# Patient Record
Sex: Male | Born: 1950 | Race: White | Hispanic: No | Marital: Married | Smoking: Never smoker
Health system: Southern US, Community
[De-identification: ages and names within clinical notes are randomized; demographics above are authoritative.]

## PROBLEM LIST (undated history)

## (undated) DIAGNOSIS — J45909 Unspecified asthma, uncomplicated: Secondary | ICD-10-CM

## (undated) DIAGNOSIS — K219 Gastro-esophageal reflux disease without esophagitis: Secondary | ICD-10-CM

## (undated) DIAGNOSIS — I10 Essential (primary) hypertension: Secondary | ICD-10-CM

## (undated) DIAGNOSIS — R7989 Other specified abnormal findings of blood chemistry: Secondary | ICD-10-CM

## (undated) DIAGNOSIS — Z5189 Encounter for other specified aftercare: Secondary | ICD-10-CM

## (undated) DIAGNOSIS — M1990 Unspecified osteoarthritis, unspecified site: Secondary | ICD-10-CM

## (undated) DIAGNOSIS — E785 Hyperlipidemia, unspecified: Secondary | ICD-10-CM

## (undated) DIAGNOSIS — Z86010 Personal history of colonic polyps: Principal | ICD-10-CM

## (undated) DIAGNOSIS — G060 Intracranial abscess and granuloma: Secondary | ICD-10-CM

## (undated) DIAGNOSIS — E119 Type 2 diabetes mellitus without complications: Secondary | ICD-10-CM

## (undated) DIAGNOSIS — R739 Hyperglycemia, unspecified: Secondary | ICD-10-CM

## (undated) DIAGNOSIS — K760 Fatty (change of) liver, not elsewhere classified: Secondary | ICD-10-CM

## (undated) DIAGNOSIS — R945 Abnormal results of liver function studies: Secondary | ICD-10-CM

## (undated) DIAGNOSIS — E669 Obesity, unspecified: Secondary | ICD-10-CM

## (undated) HISTORY — DX: Unspecified osteoarthritis, unspecified site: M19.90

## (undated) HISTORY — DX: Essential (primary) hypertension: I10

## (undated) HISTORY — DX: Gastro-esophageal reflux disease without esophagitis: K21.9

## (undated) HISTORY — DX: Type 2 diabetes mellitus without complications: E11.9

## (undated) HISTORY — DX: Personal history of colonic polyps: Z86.010

## (undated) HISTORY — PX: BRAIN SURGERY: SHX531

## (undated) HISTORY — DX: Hyperlipidemia, unspecified: E78.5

## (undated) HISTORY — DX: Abnormal results of liver function studies: R94.5

## (undated) HISTORY — DX: Intracranial abscess and granuloma: G06.0

## (undated) HISTORY — DX: Encounter for other specified aftercare: Z51.89

## (undated) HISTORY — PX: TONSILLECTOMY: SUR1361

## (undated) HISTORY — DX: Unspecified asthma, uncomplicated: J45.909

## (undated) HISTORY — DX: Other specified abnormal findings of blood chemistry: R79.89

## (undated) HISTORY — DX: Hyperglycemia, unspecified: R73.9

## (undated) HISTORY — DX: Fatty (change of) liver, not elsewhere classified: K76.0

## (undated) HISTORY — PX: COLONOSCOPY: SHX174

## (undated) HISTORY — DX: Obesity, unspecified: E66.9

## (undated) HISTORY — PX: OTHER SURGICAL HISTORY: SHX169

---

## 2001-08-15 ENCOUNTER — Encounter: Payer: Self-pay | Admitting: Endocrinology

## 2001-08-15 ENCOUNTER — Encounter: Admission: RE | Admit: 2001-08-15 | Discharge: 2001-08-15 | Payer: Self-pay | Admitting: Endocrinology

## 2001-08-22 ENCOUNTER — Ambulatory Visit (HOSPITAL_COMMUNITY): Admission: RE | Admit: 2001-08-22 | Discharge: 2001-08-22 | Payer: Self-pay | Admitting: Internal Medicine

## 2004-05-05 ENCOUNTER — Ambulatory Visit (HOSPITAL_COMMUNITY): Admission: RE | Admit: 2004-05-05 | Discharge: 2004-05-05 | Payer: Self-pay | Admitting: Gastroenterology

## 2004-05-05 ENCOUNTER — Encounter (INDEPENDENT_AMBULATORY_CARE_PROVIDER_SITE_OTHER): Payer: Self-pay | Admitting: Specialist

## 2004-08-14 ENCOUNTER — Encounter: Admission: RE | Admit: 2004-08-14 | Discharge: 2004-08-14 | Payer: Self-pay | Admitting: Internal Medicine

## 2004-08-21 ENCOUNTER — Encounter: Admission: RE | Admit: 2004-08-21 | Discharge: 2004-08-21 | Payer: Self-pay | Admitting: Internal Medicine

## 2005-07-01 IMAGING — CT CT ABDOMEN WO/W CM
2 of 4 series · 13 of 32 positions shown, 19 images · IV contrast (GASTRO.)
Comparison: none

CLINICAL DATA: Please evaluate right renal lesion. 
 CT OF THE ABDOMEN WITHOUT AND WITH IV CONTRAST 
 There is a 3.2 cm in size lesion projecting posteriorly from the cortex of the right kidney within its mid to upper pole portion.  This measures 26 plus or minus 18 Hounsfield units prior to IV contrast and 32 plus or minus 20 Hounsfield units following contrast.  As a result, I believe this most likely represents a mildly complicated cyst.  There are also smaller cysts seen associated with the left kidney.  There is diffuse fatty change noted within the liver and there is a probable small hiatal hernia present.  On several of the scans, there is a questionable tiny gallstone within the neck of the gallbladder.  There is a circumaortic left renal vein noted. 
 IMPRESSION
 1.  3.2 cm in size lesion associated with the posterior right kidney felt most likely to represent a mildly complicated renal cyst (please see above discussion).  
 2.  Fatty change noted within the liver. 
 3.  Question subtle gallstone within the neck of the gallbladder. 
 4.  Small hiatal hernia.  
 5.  Circumaortic left renal vein incidentally noted.

[Series 3: — · axial · 0.94mm/px · z∈[-262,+8]mm · 11 of 66 slices shown, 17 images (1 of 2)]
[im 6/66  soft-tissue]
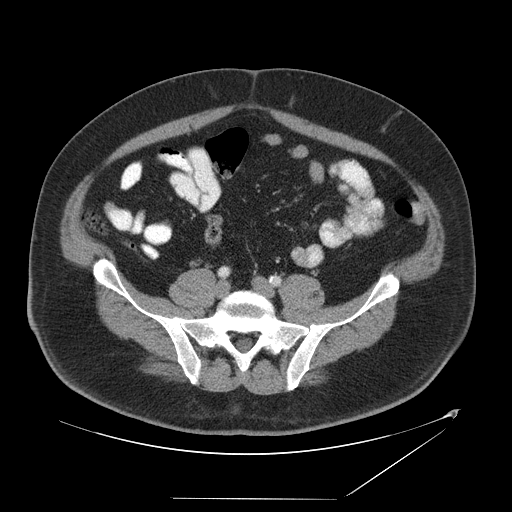
[im 6/66  bone]
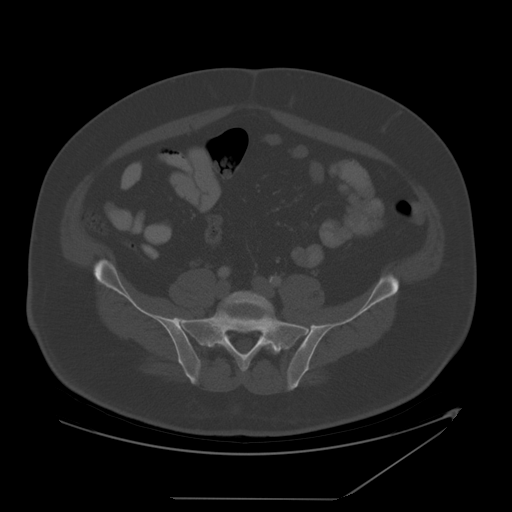
[im 11/66  soft-tissue]
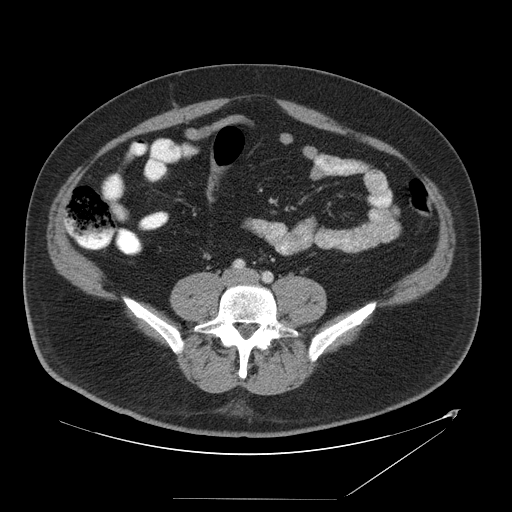
[im 17/66  soft-tissue]
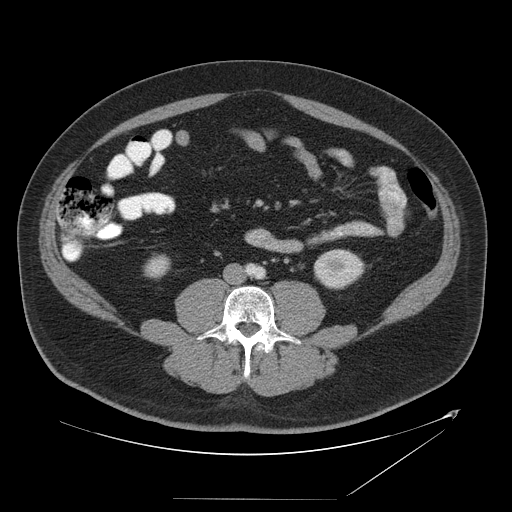
[im 22/66  soft-tissue]
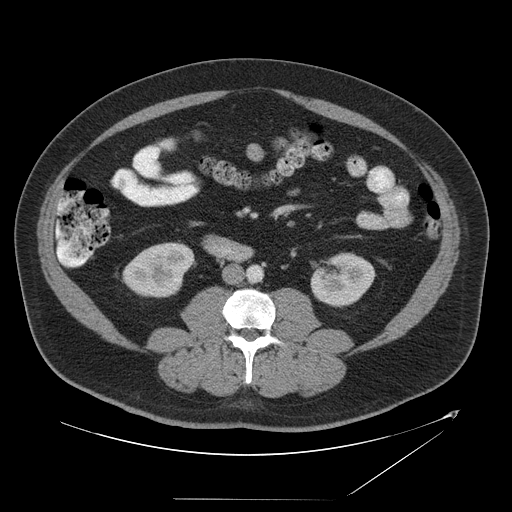
[im 28/66  soft-tissue]
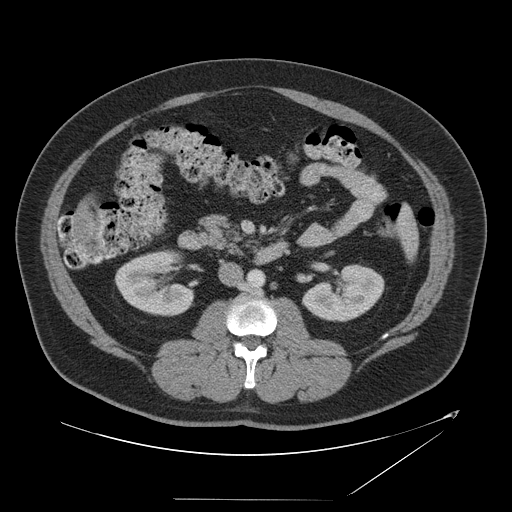
[im 33/66  soft-tissue]
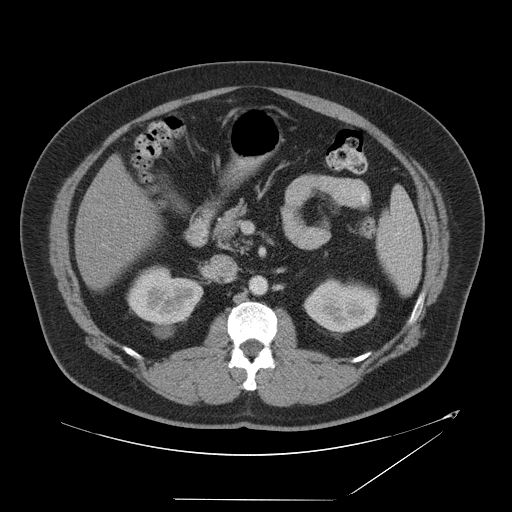
[im 38/66  soft-tissue]
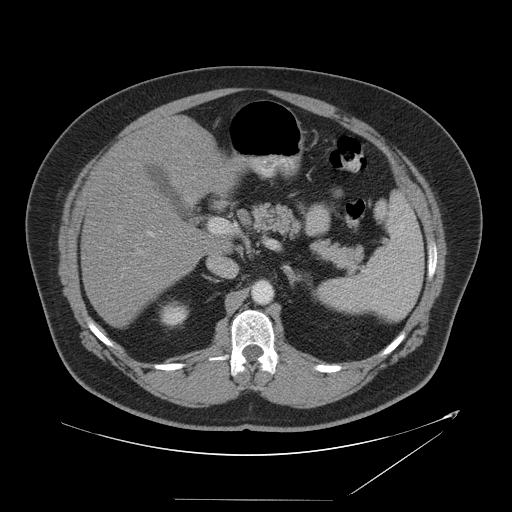
[im 44/66  soft-tissue]
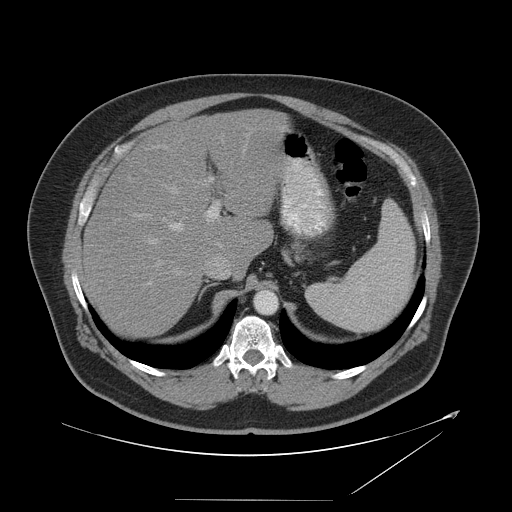
[im 44/66  lung]
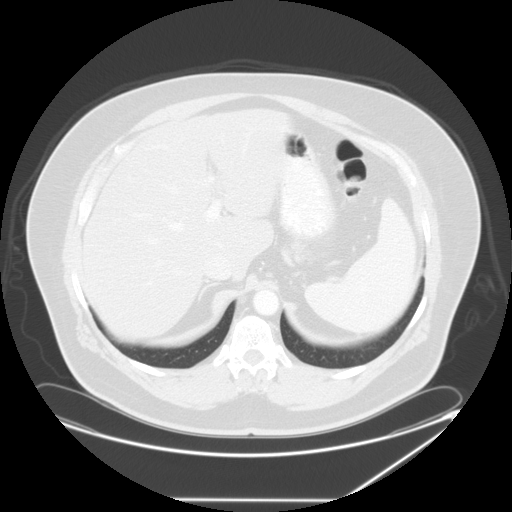
[im 49/66  soft-tissue]
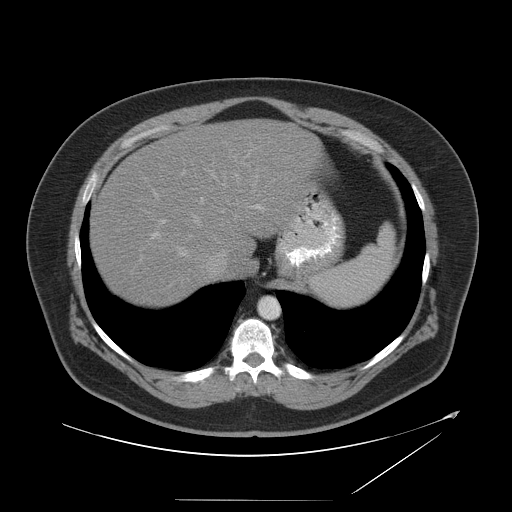
[im 49/66  lung]
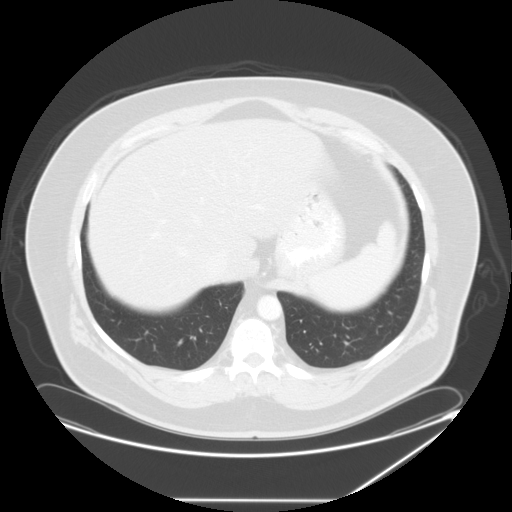
[im 49/66  bone]
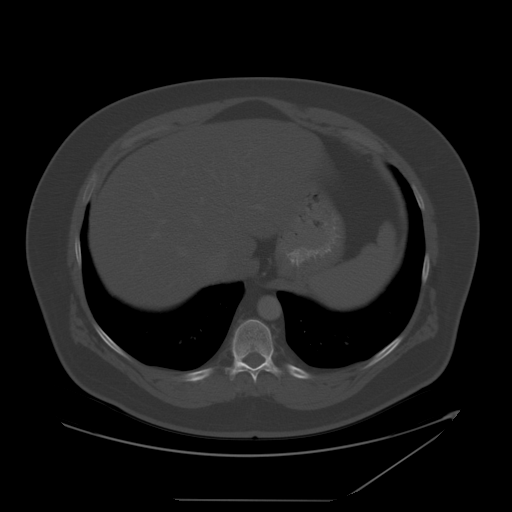
[im 55/66  soft-tissue]
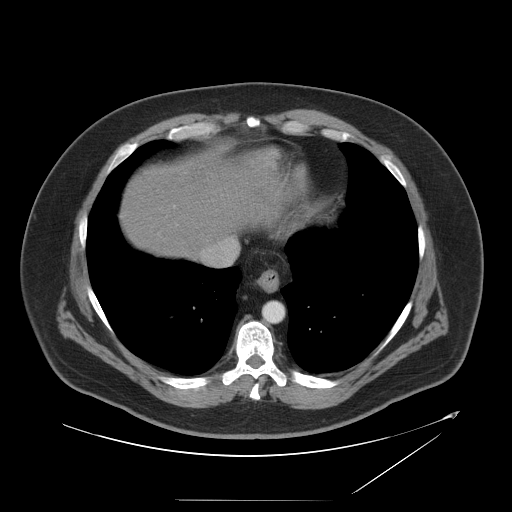
[im 55/66  lung]
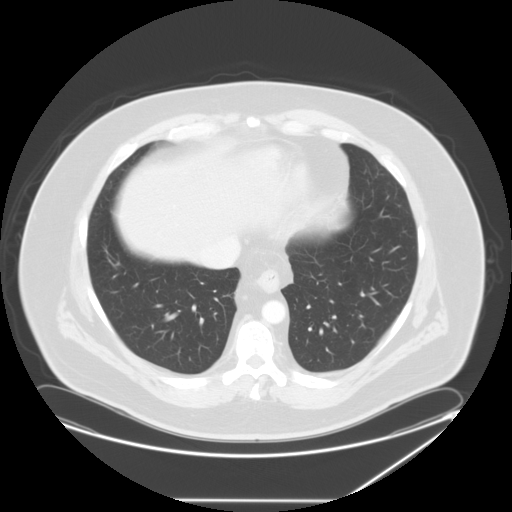
[im 60/66  soft-tissue]
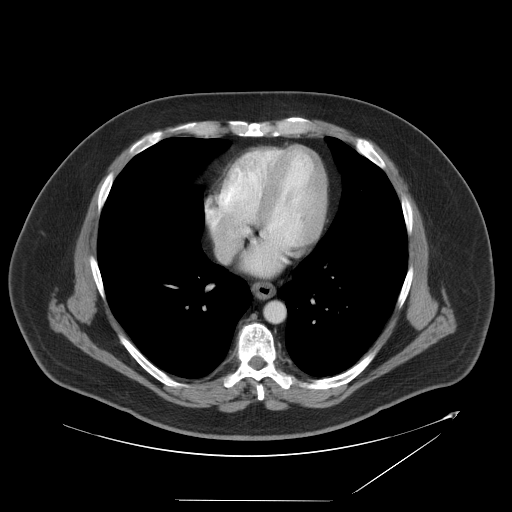
[im 60/66  lung]
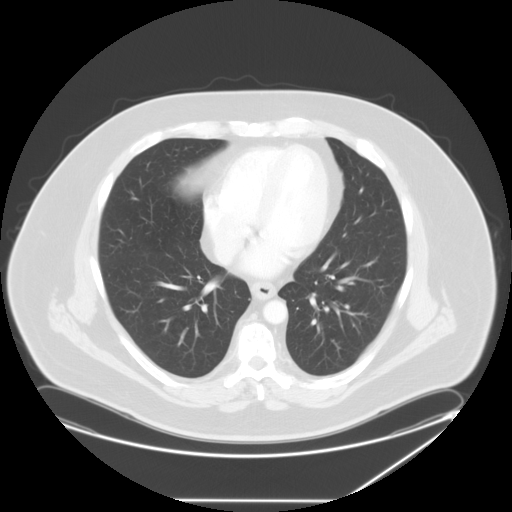

[Series 103: — · axial · 0.94mm/px · z∈[-77,-47]mm · 2 of 29 slices shown (2 of 2)]
[im 6/29  soft-tissue]
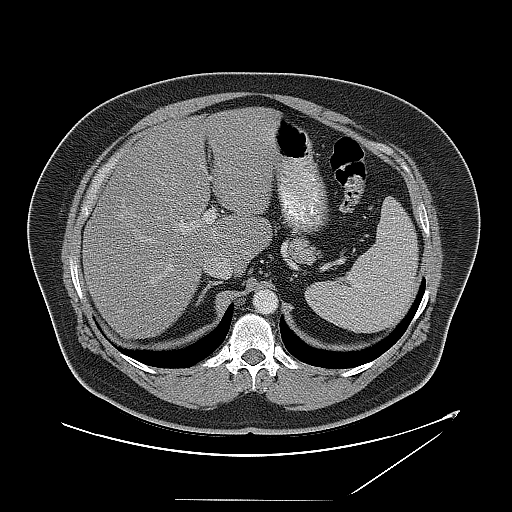
[im 12/29  soft-tissue]
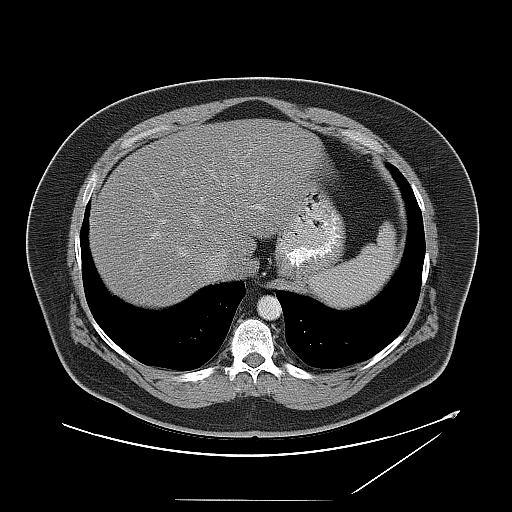

[13 of 32 positions shown; findings below may reference images not displayed]

## 2006-05-23 ENCOUNTER — Inpatient Hospital Stay (HOSPITAL_COMMUNITY): Admission: AD | Admit: 2006-05-23 | Discharge: 2006-05-26 | Payer: Self-pay | Admitting: Internal Medicine

## 2006-05-31 ENCOUNTER — Encounter (INDEPENDENT_AMBULATORY_CARE_PROVIDER_SITE_OTHER): Payer: Self-pay | Admitting: Specialist

## 2006-05-31 ENCOUNTER — Ambulatory Visit: Payer: Self-pay | Admitting: Infectious Diseases

## 2006-05-31 ENCOUNTER — Inpatient Hospital Stay (HOSPITAL_COMMUNITY): Admission: RE | Admit: 2006-05-31 | Discharge: 2006-06-07 | Payer: Self-pay | Admitting: Neurosurgery

## 2006-06-01 ENCOUNTER — Ambulatory Visit: Payer: Self-pay | Admitting: Cardiology

## 2006-06-01 ENCOUNTER — Encounter: Payer: Self-pay | Admitting: Cardiology

## 2006-06-06 ENCOUNTER — Encounter: Payer: Self-pay | Admitting: Cardiology

## 2006-06-17 ENCOUNTER — Inpatient Hospital Stay (HOSPITAL_COMMUNITY): Admission: EM | Admit: 2006-06-17 | Discharge: 2006-06-23 | Payer: Self-pay | Admitting: Emergency Medicine

## 2006-07-12 ENCOUNTER — Encounter: Admission: RE | Admit: 2006-07-12 | Discharge: 2006-07-12 | Payer: Self-pay | Admitting: Neurosurgery

## 2006-10-21 ENCOUNTER — Encounter: Admission: RE | Admit: 2006-10-21 | Discharge: 2006-10-21 | Payer: Self-pay | Admitting: Neurosurgery

## 2007-04-27 IMAGING — CR DG CHEST 2V
2 series · 2 of 2 positions shown · non-contrast
Comparison: 06/01/06.

CLINICAL DATA: Fever.  Pain.  Postop 2 weeks ago from brain tumor resection. 
 CHEST - 2 VIEW:

[view not recorded (1 of 2)]
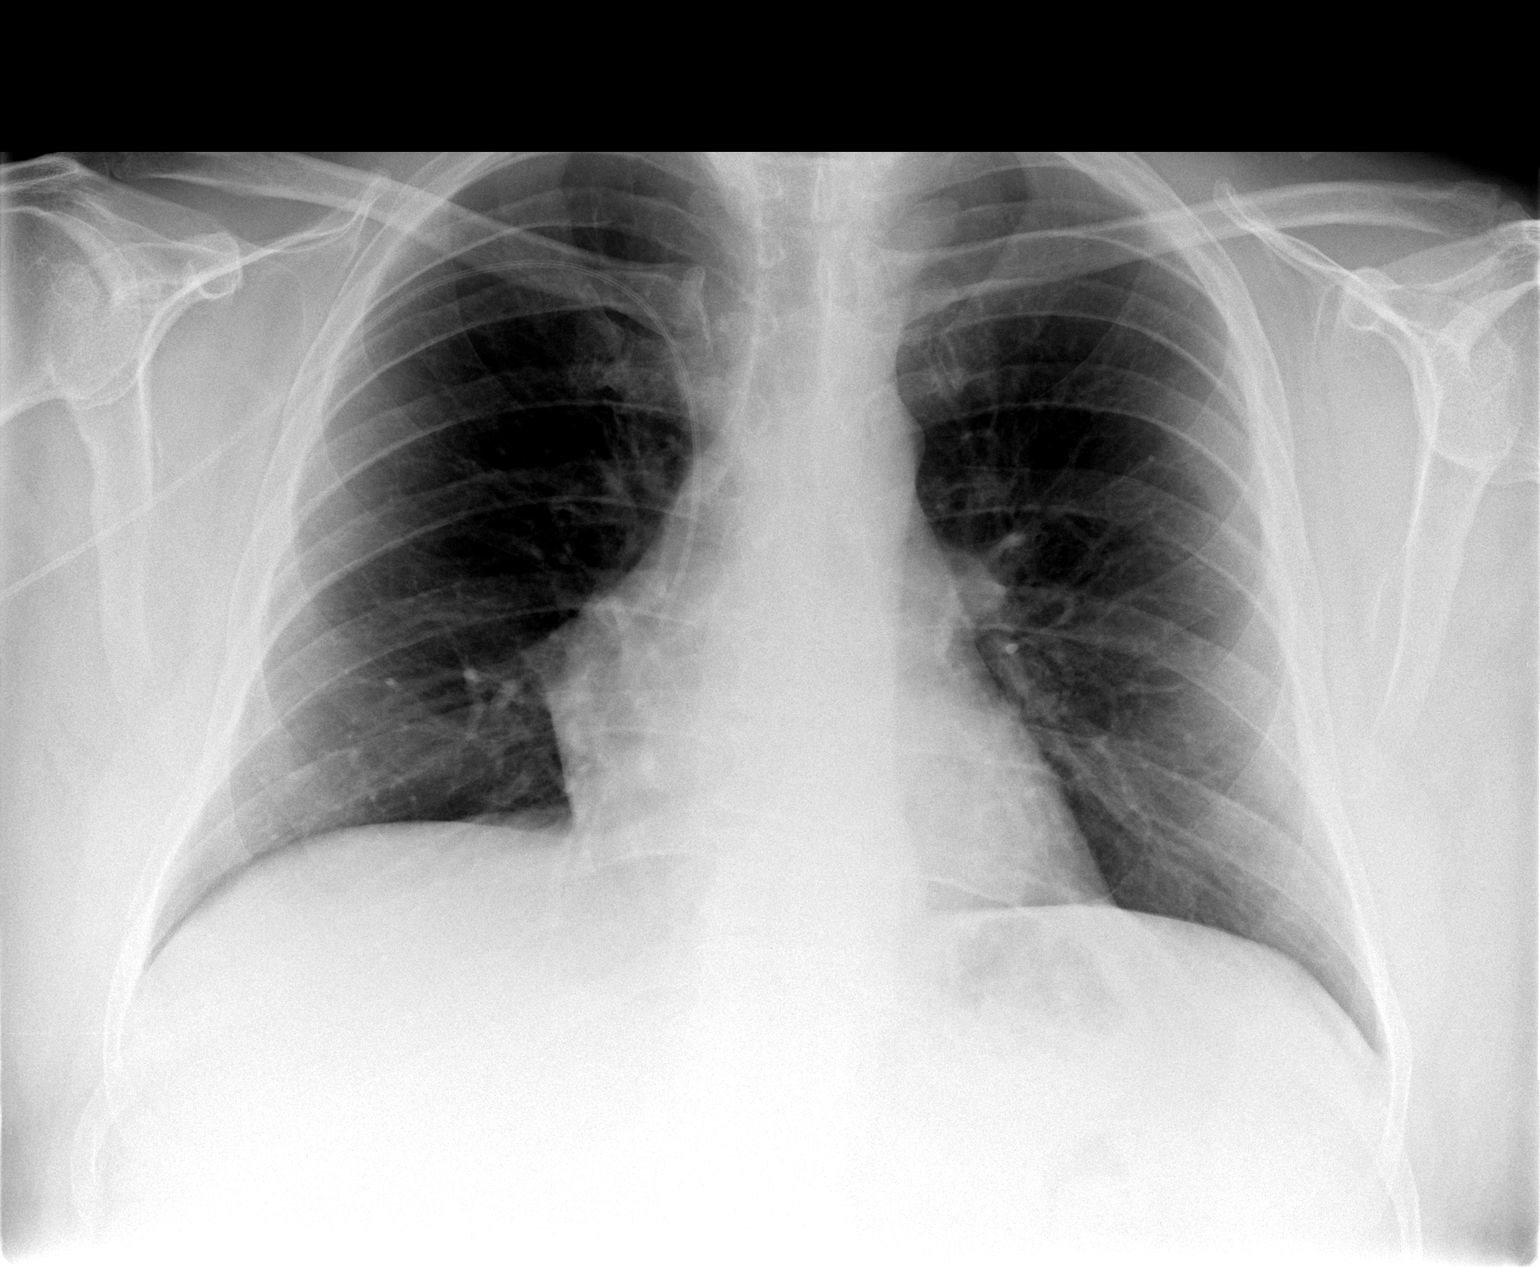

[view not recorded (2 of 2)]
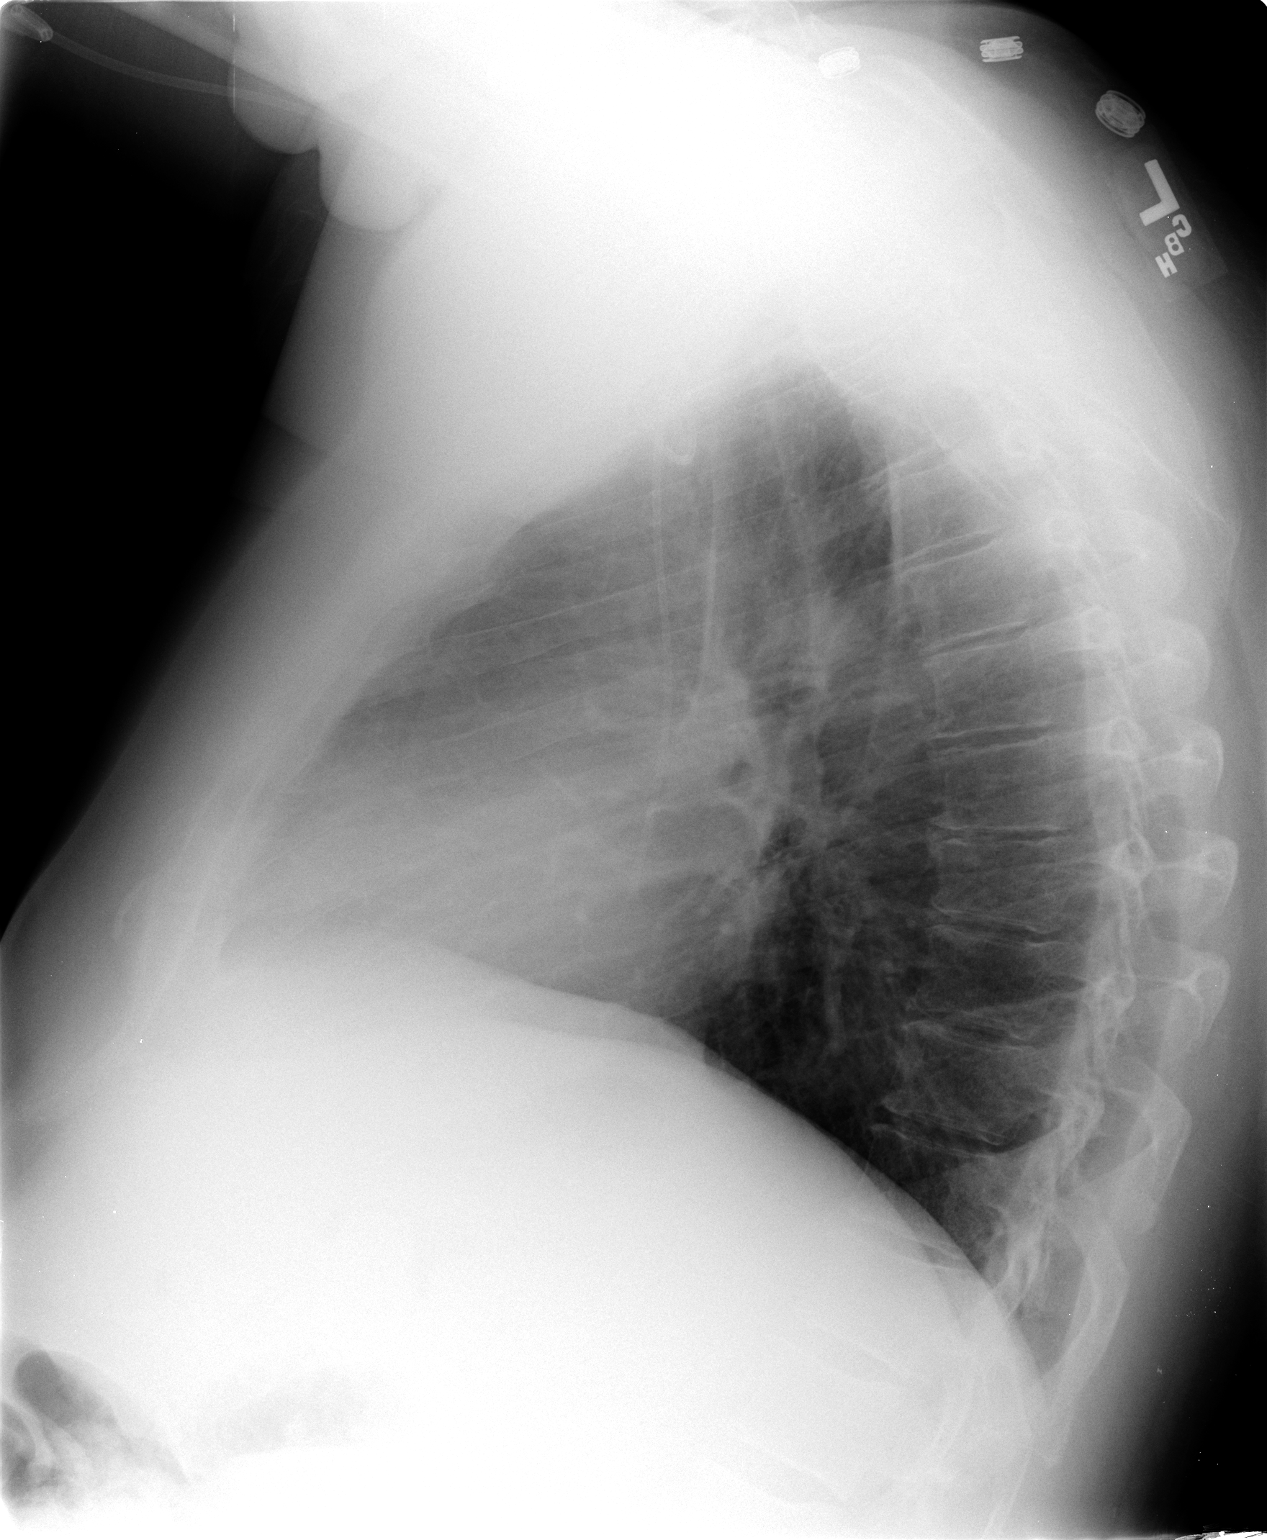

[2 of 2 positions shown; findings below may reference images not displayed]

FINDINGS: Two view exam of the chest shows no focal consolidation, edema, or effusion.  The cardiopericardial silhouette is stable in size and appearance.  Right sided PICC line remains in place.
IMPRESSION: Stable exam without acute cardiopulmonary findings.

## 2008-03-20 ENCOUNTER — Encounter: Admission: RE | Admit: 2008-03-20 | Discharge: 2008-03-20 | Payer: Self-pay | Admitting: Internal Medicine

## 2009-07-29 DIAGNOSIS — Z860100 Personal history of colon polyps, unspecified: Secondary | ICD-10-CM

## 2009-07-29 HISTORY — DX: Personal history of colonic polyps: Z86.010

## 2009-07-29 HISTORY — DX: Personal history of colon polyps, unspecified: Z86.0100

## 2011-05-07 NOTE — Procedures (Signed)
REQUESTED BY:  Bevelyn Buckles. Nash Shearer, M.D.   PROCEDURE:  Electroencephalogram.   HISTORY:  The patient is described as awake.  This is a routine  electroencephalogram done with photic stimulation and hyperventilation.  This is a 60 year old male with a history of visual changes.  The  electroencephalogram is performed for evaluation of possible seizure.   DESCRIPTION OF PROCEDURE:  The normal background rhythm in this tracing is  seen only in fragments and is a low to moderate amplitude alpha rhythm of 10-  11 Hz, which appears posteriorly.  More frequently the record is dominated  by low amplitude fast activity of 12-15 Hz which appears diffusely without  abnormal asymmetry.  No focal slowing is noted.  No epileptiform discharges  are seen.  The patient remained in the awake state throughout much of the  recording, although there is some drowsiness as evidenced by attenuation of  the underlying rhythms and appearance of some very low amplitude 7-8 Hz  theta rhythms.  No abnormalities are seen in drowsiness.  Photic stimulation  produced bilateral symmetric driving responses.  Hyperventilation produced  mildly increased prominence of the alpha, but otherwise no changes.   A single channel devoted to electrocardiogram revealed a sinus rhythm  throughout with a rate of approximately 72 beats per minute.   CONCLUSION:  Normal study in the awake and slightly drowsy state.      Michael L. Thad Ranger, M.D.  Electronically Signed     EXB:MWUX  D:  05/24/2006 16:36:43  T:  05/25/2006 10:54:21  Job #:  [id number]

## 2011-05-07 NOTE — Discharge Summary (Signed)
[name], [name] NO.:  [id number]   MEDICAL RECORD NO.:  [id number]          PATIENT TYPE:  INP   LOCATION:  3025                         FACILITY:  [facility]   PHYSICIAN:  Danae Orleans. Venetia Maxon, M.D.  DATE OF BIRTH:  [birth year]   DATE OF ADMISSION:  05/31/2006  DATE OF DISCHARGE:  06/07/2006                                 DISCHARGE SUMMARY   DATE OF ADMISSION:  May 25, 2006   DATE OF DISCHARGE:  June 07, 2006   REASON FOR ADMISSION:  1. Intracranial abscess.  2. Convulsions.  3. Obstructive sleep apnea.  4. Hypertension.  5. Hyperlipidemia.  6. Bacterial infection due to streptococcus.  7. Unspecified constipation.  8. Stomach function disorder.  9. Abnormal glucose.  10.Adverse effect of corticosteroids.   FINAL DIAGNOSES:  1. Intracranial abscess.  2. Convulsions.  3. Obstructive sleep apnea.  4. Hypertension.  5. Hyperlipidemia.  6. Bacterial infection due to streptococcus.  7. Unspecified constipation.  8. Stomach function disorder.  9. Abnormal glucose.  10.Adverse effects of corticosteroids.   HISTORY OF ILLNESS AND HOSPITAL COURSE:  Lawrence Johnson is a 60 year old man,  who was found to have visual disturbance, which led to an MRI, which led to  the identification of a right occipital mass.  This is felt to represent a  tumor, although metastatic workup is otherwise negative.  The patient is  admitted to the hospital on May 31, 2006, and underwent uncomplicated right  occipital craniotomy for what turned out to be an abscess.  This was felt to  be consistent with a streptococcal abscess.  The patient was started on IV  Rocephin and Flagyl.  The patient was seen by critical care medicine  postoperatively.  On June 13, he was awake, alert and conversant, moving all  extremities.  Was on Rocephin and Flagyl.  Mobilized as tolerated.  Head CT  showed mild edema, but otherwise doing well and he underwent PIC line  placement.  He appeared to be  improving and was transferred to the floor.  He  underwent a transesophageal echocardiogram to rule out valvular vegetation.  The abscess culture was consistent with a microaerophilic streptococcus,  which is penicillin sensitive.  The patient was discharged home, doing well  on June 07, 2006, with instructions to follow up in my office in 2 weeks  postoperatively.      Danae Orleans. Venetia Maxon, M.D.  Electronically Signed     JDS/MEDQ  D:  08/11/2006  T:  08/12/2006  Job:  [id number]
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
## 2011-05-07 NOTE — Procedures (Signed)
EEG NUMBER:  [id number].   DATE OF EEG:  June 21, 2006.   CLINICAL HISTORY:  A 60 year old male being evaluated for possible seizures.   MEDICATION LIST:  Phenobarbital, Ambien, Diovan, vitamins, fish oil, folate,  Prilosec, Rocephin, Amaryl.   This is a portable EEG study performed with the patient in the awake state.   Background awake rhythm consists of 9 Hz alpha, which is of moderate  amplitude and clinically not reactive to eye opening and closure.  No  paroxysmal epileptiform activity, spikes or sharp waves are seen.  A  moderate amount of high frequency, low amplitude beta activity is seen  throughout the tracing, which may be a medication effect.  The length of the  tracing is 20.2 minutes, sleep stages are not seen in this tracing.  Technical quality is average.  EKG tracing reveals regular sinus rhythm.  Hyperventilation and photic stimulation were not performed, this being a  portable study.   IMPRESSION:  This electroencephalogram performed during the wakeful state is  within normal limits.  No epileptiform activities are identified.           ______________________________  Sunny Schlein. Pearlean Brownie, MD     YNW:GNFA  D:  06/21/2006 16:13:17  T:  06/21/2006 19:18:23  Job #:  [id number]

## 2011-05-07 NOTE — Op Note (Signed)
NAMECALEL, PISARSKI NO.:  [id number]   MEDICAL RECORD NO.:  [id number]          PATIENT TYPE:  INP   LOCATION:  3110                         FACILITY:  [facility]   PHYSICIAN:  Danae Orleans. Venetia Maxon, M.D.  DATE OF BIRTH:  [birth year]   DATE OF PROCEDURE:  05/31/2006  DATE OF DISCHARGE:                                 OPERATIVE REPORT   PREOPERATIVE DIAGNOSIS:  Right occipital brain tumor.   POSTOPERATIVE DIAGNOSIS:  Right occipital brain abscess.   PROCEDURE:  Right occipital craniotomy for abscess with microdissection.   SURGEON:  Danae Orleans. Venetia Maxon, MD.   ASSISTANT:  Coletta Memos, MD.   ANESTHESIA:  General endotracheal anesthesia.   ESTIMATED BLOOD LOSS:  Minimal.   COMPLICATIONS:  None.   DISPOSITION:  Recovery.   INDICATIONS:  Lawrence Johnson is a 60 year old man with a right occipital ring-  enhancing mass, which was felt to represent a melanoma on preoperative  imaging.  He was having visual changes and headache, and it was elected to  take him to surgery for craniotomy for tumor after a metastatic workup was  otherwise negative.   PROCEDURE:  Lawrence Johnson was brought to the operating room.  Following a  satisfactory and uncomplicated induction of general endotracheal anesthesia  and the placement of intravenous lines and a Foley catheter and arterial  line, he was placed into three-pin head fixation and turned onto a prone  position on the operating table with his neck flexed and his head elevated  approximately 30 degrees.  His posterior scalp was then shaved and prepped  and draped in the usual sterile fashion.  Care was taken to mark the midline  and also the position of the inion in the transverse sinus.  Subsequently,  one limb of the incision was marked approximately 2 cm to the left of the  midline, crossing the midline, and coming to just behind the ear on the  right side of the midline.  The skin and subcutaneous tissues were  infiltrated with 25%  Marcaine and 50% Lidocaine with 1:200,000 epinephrine.  The incision was made, Raney clips were applied, the scalp flap was brought  back upon its base after muscle was elevated with the scalp flap.  The  sagittal suture was identified, and three bur holes were made just to the  right of the midline along the edge of the sagittal suture.  An additional  bur hole was made in the inferolateral aspect just above what was felt to be  the transverse sinus, and an additional bur hole was placed overlying the  lambdoid suture laterally.  The dura was carefully dissected from the skull  and, using the craniotome attachment, the bone flap was elevated.  There was  no bleeding or evidence of any disruption of the sinus or torcula.  Subsequently the dura, which appeared to be fairly tense, was incised, and  upon incising the dura, and quite surprising to [place], a significant amount of  pus was liberated.  The dura was then opened further and there was a  significant amount of pus and  cerebritis, and then the location of where the  tumor was expected was opened and this resulted in a significant expression  of yellowish and also somewhat brownish-colored pus with a strong, feculent  odor.  The brain appeared to be inflamed, and the occipital pole herniated  out of the dural opening.  We opened the dura further so as not to put more  pressure on the occipital pole and then also raised the head of the bed,  gave the patient additional Mannitol, and hyperventilated the patient.  The  abscess itself was removed under microscopic visualization, and bipolar  electrocautery was used to cauterize any bleeding points.  With these  maneuvers of elevating the head of the bed and hyperventilation, the  occipital lobe did not appear to be under nearly as much pressure.  A  yellowish membrane was stripped from overlying the occipital lobe and,  additionally, yellowish, densely adherent material was also removed from  the  dura, and this was all sent to the pathologist.  Multiple specimens of pus  including about 5 ml of frank pus were sent to the lab for gram stain,  culture and sensitivities.  It was elected not to close the dura but rather  a piece of 4-cm x 4-cm Duraguard was laid over the occipital pole, and the  bone flap was then replaced with plates.  The galea was reapproximated with  #2-0 Vicryl sutures, then the skin edges were reapproximated with staples.  A sterile, occlusive dressing was placed.  Because the patient has a very  large body habitus and has obstructive sleep apnea and given the occipital  swelling, it was elected to leave the patient intubated, and he was then  placed in the supine position, the pins were removed, and he was taken to  the recovery room still intubated.      Danae Orleans. Venetia Maxon, M.D.  Electronically Signed     JDS/MEDQ  D:  05/31/2006  T:  05/31/2006  Job:  [id number]

## 2011-05-07 NOTE — Discharge Summary (Signed)
Lawrence Johnson, Lawrence Johnson                 ACCOUNT NO.:  [id number]   MEDICAL RECORD NO.:  [id number]          PATIENT TYPE:  INP   LOCATION:  3023                         FACILITY:  [facility]   PHYSICIAN:  Tera Mater. Evlyn Kanner, M.D. DATE OF BIRTH:  [birth year]   DATE OF ADMISSION:  06/17/2006  DATE OF DISCHARGE:  06/23/2006                                 DISCHARGE SUMMARY   DISCHARGE DIAGNOSES:  1.  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  2.  Probable drug reaction to phenytoin and Dilantin, resolving.  3.  Hypertension.  4.  Hyperlipidemia.  5.  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  6.  Obstructive sleep apnea.   CONSULTATIONS:  Infectious disease, Dr. Burnice Logan; neurology, Dr. Delia Heady; neurosurgery.   PROCEDURE:  Bubble study to rule out patent foramen ovale.   HISTORY OF PRESENT ILLNESS:  Lawrence Johnson is a 60 year old white male  readmitted by Dr. Wylene Simmer here on June 17, 2006.  He had been in the  hospital and had a craniotomy May 31, 2006, for what turned out to be a  microaerophilic brain abscess.  He was on home antibiotics and prophylactic  Dilantin with no history of seizures.  He presented with fevers again and  diffuse rash.  He was clinically toxic and had fevers as high as 101 with  sweats.  The various consultants felt like this was most likely due to his  Dilantin level and his antibiotics.  In fact, he has been continued on the  Rocephin with gradually improving clinical status.  His last febrile day was  June 21, 2006, on which he went to 100.5.  Yesterday, T max was 98.5.  He has  no chills and sweats and his rash is dramatically better.  He has had no  breathing trouble, no respiratory compromise during any of this, and no  evidence of any angioedema-type reaction.  He has been eating and drinking  well.  He has been ambulating.  He is not having any pruritus at the present  time and minimal headache  is present.  Fortunately, the workup for unusual  findings has been negative.  The TCD bubble study had no HITS noted prior to  or after Valsalva.  There is no evidence of right-to-left intracardiac or  intrapulmonary shunt.  The consultants felt like since the anticonvulsants  were primarily for a prophylactic reason and the fact that initially this  was thought to be a malignancy and did not turn out to be, that he could  discontinue all of his antiseizure medicines and this has been done.  The  patient needs to complete 12 more days of Rocephin.  He is comfortable with  going home and administering twice-daily IV Rocephin with the help of the  [facility] agency.  This has been set up.   LABORATORY DATA:  White count initially 4900, hemoglobin 11.8, platelets  127,000.  On repeat levels, white counts remained normal at 6 and 4.9.  His  sodium was 134 on presentation,  potassium 3.6, chloride 100, CO2 28, BUN 7,  creatinine 0.8, glucose 124.  Total protein 6.6, albumin 3.3.  AST was  normal at 22, ALT normal at 38.  ALT normal at 57.  Total bili 0.7.  Urinalysis was negative.  Blood cultures done on June 29 x 2, remained  negative.  Urine culture was negative.  Repeat chemistries on July 2, sodium  135, potassium 3.8, chloride 102, CO2 26, BUN 7, creatinine 0.8, glucose  123, calcium 8.6.  There were labs done this morning with a hemoglobin of  10.7 and a white count of 4.5, platelets 188,000.  Phenobarbital was 6,  Dilantin level less than 0.25.  EEG, also should be noted was done on July  2, with no evidence of any seizure focus.   In summary, we have a 60 year old white male presenting with a known brain  abscess, now presenting with fever and rash.  This is felt to be due to  Dilantin allergy and the patient is clinically improved by stopping this.  He is clinically doing well with no evidence of continued toxicity.   MEDICATIONS:  1.  Rocephin 2 g IV twice daily for  12 more days.  2.  Diovan 160 once daily.  3.  Vytorin 10/40 once daily.  4.  Omnicor 2 g daily.  5.  Prilosec 20 mg daily.  6.  Ambien as needed for sleep.  7.  Folate 1 mg daily.  8.  Amaryl 1 mg daily.   He is to discontinue his Dilantin.  He is to call if he has fever above  100.5.  He is to call if he has chills or sweats.  He is to let us know if  he has worsening headaches or worsening of his rash.  He does have a blood  sugar monitor at home and will check as he did before.  He will see Dr.  Timothy Lasso in the office next week for a recheck.  His diet will be as before  with a carbohydrate limitation.           ______________________________  Tera Mater Evlyn Kanner, M.D.     SAS/MEDQ  D:  06/23/2006  T:  06/23/2006  Job:  [id number]

## 2011-05-07 NOTE — Consult Note (Signed)
Lawrence Johnson, Lawrence Johnson                 ACCOUNT NO.:  [id number]   MEDICAL RECORD NO.:  [id number]          PATIENT TYPE:  INP   LOCATION:  3023                         FACILITY:  [facility]   PHYSICIAN:  Pramod P. Pearlean Brownie, MD    DATE OF BIRTH:  [birth year]   DATE OF CONSULTATION:  DATE OF DISCHARGE:                                   CONSULTATION   REFERRING PHYSICIAN:  Dr. Burnice Logan.   REASON FOR REFERRAL:  Evaluate for patent foramen ovale and right-to-left  shunt.   HISTORY OF PRESENT ILLNESS:  Lawrence Johnson is a 60 year old Caucasian male who  was recently admitted in early June with visual disturbances.  He was found  to have a right occipital lesion which was initially thought to be a tumor.  The patient underwent a craniotomy, but the lesion was found to be necrotic  and then a brain abscess.  He has been treated with IV Rocephin and Dilantin  was started for seizure prophylaxis.  The patient was readmitted with some  fever and was found to have a progressive rash which was thought to be  related to Dilantin, which has been recently discontinued.  The patient has  been started on phenobarbital.  The patient denies any definite history such  as history of stroke, TIA or seizures.   MEDICATION ALLERGIES:  TEGADERM and now suspected DILANTIN.   HOME MEDICATIONS:  Ambien, Diovan, Vytorin, fish oil, folate, Dilantin,  Prilosec, Rocephin, Amaryl.   PAST MEDICAL HISTORY:  1.  Hypertension.  2.  Hyperlipidemia.  3.  Sleep apnea.  4.  Impaired glucose tolerance.   PAST SURGICAL HISTORY:  1.  Septoplasty.  2.  Polypectomy.   SOCIAL HISTORY:  The patient lives in [place].  __________ , does not  smoke or drink.   REVIEW OF SYSTEMS:  As stated above.   PHYSICAL EXAMINATION:  GENERAL:  Physical exam reveals an obese young  Caucasian male not in distress.  VITAL SIGNS:  Afebrile, pulse at 72 per minute, regular, respiratory rate 16  per minute.  HEENT:  Head is nontraumatic.  ENT  exam unremarkable.  NECK:  Supple without bruit.  CARDIAC:  No murmur or gallop.  LUNGS:  Clear to auscultation.  NEUROLOGICAL:  The patient is pleasant, awake, alert, cooperative with no  aphasia, apraxia or dysarthria.  There is no visual field deficit on right-  side testing, though he does have some difficulty reading from the left side  of his vision.  There is no facial weakness.  There is no upper extremity  drift.  Palatal movements are normal.  Tongue is midline.  Motor system exam  reveals symmetric strength, tone and reflexes.  His gait was not tested.   DATA REVIEWED:  Previous hospital admission discharge summary noted MRI  findings and brain abscess as discussed above and reviewed.   IMPRESSION:  [age]-year-old gentleman with right occipital abscess with  recent transesophageal echocardiogram showing findings suggestive Chiari  malformation, but no definite patent foramen ovale.  It would be appropriate  to do a transcranial Doppler bubble study to look  for intracardiac or  intrapulmonary shunt.   DESCRIPTION OF PROCEDURE:  After taking verbal informed consent from the  patient and explaining the risks and benefits, transcranial Doppler bubble  study was performed at the bedside.  Due to technical difficulties, both  middle cerebral arteries could not be simultaneously insonated.  The  procedure was performed after insonating the right middle cerebral artery  with a hand-held probe.  Agitated saline was injected into the right PICC  line and no high-intensity transient signals were identified.  The procedure  was repeated, followed by Valsalva maneuver and again no __________  were  identified.   IMPRESSION:  Negative transcranial Doppler bubble study.  No suggestive of  intracardiac or intrapulmonary shunt by 2-D study.  Results were explained  to the patient.  The patient had a rash on Dilantin and has been started on  phenobarbital.  There is a 40% to 50%  cross-reactivity of the rash between  Dilantin, phenobarbital and Carbamazepine and since I would recommend  discontinuing the phenobarbital.  I am not sure the patient clearly needs  anticonvulsants, since he has had no documented seizures.  I have discussed  this case with Dr. Maeola Harman and I would recommend doing an EEG.  If the  EEG shows a lot of sharp activity, we may start the patient on Depakote,  otherwise leave him off anticonvulsants.   Thank you for this referral.  Kindly call for questions.           ______________________________  Sunny Schlein. Pearlean Brownie, MD     PPS/MEDQ  D:  06/21/2006  T:  06/21/2006  Job:  [id number]   cc:   Theressa Millard, M.D.  Fax: [phone]
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
## 2011-05-07 NOTE — Op Note (Signed)
NAME:  Lawrence Johnson, Lawrence Johnson                           ACCOUNT NO.:  [id number]   MEDICAL RECORD NO.:  [id number]                   PATIENT TYPE:  AMB   LOCATION:  ENDO                                 [facility]AC[facility]   PHYSICIAN:  Graylin Shiver, M.D.                DATE OF BIRTH:  12-29-1950   DATE OF PROCEDURE:  05/05/2004  DATE OF DISCHARGE:                                 OPERATIVE REPORT   PROCEDURE:  Colonoscopy with polypectomy and biopsy.   INDICATION FOR PROCEDURE:  Screening.   INFORMED CONSENT:  Informed consent was obtained after explanation of the  risks of bleeding, infection, and perforation.   PREMEDICATIONS:  1. Fentanyl 87.5 mcg IV.  2. Versed 9 mg IV.   DESCRIPTION OF PROCEDURE:  With the patient in the left lateral decubitus  position, a rectal exam was performed; no masses were felt.  The Olympus  colonoscope was inserted into the rectum and advanced around the colon to  the cecum.  Cecal landmarks were identified.  At the end of the cecum, there  was a 7 mm sessile polyp.  This was snared with a mini snare and removed by  snare cautery technique.  The polyp was retrieved.  The cautery site looked  good.  The ascending colon looked normal.  The transverse colon looked  normal.  The descending colon looked normal.  In the sigmoid colon, there  were 4 small polyps which were biopsied off with colon forceps.  The rectum  looked normal.  He tolerated the procedure well without complications.   IMPRESSION:  Colon polyps, diagnosis code 211.3   PLAN:  The pathology will be checked.                                               Graylin Shiver, M.D.    SFG/MEDQ  D:  05/05/2004  T:  05/05/2004  Job:  [id number]   cc:   Gwen Pounds, M.D.  [address]  [place]  [place] [address]  Fax: [phone]
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
## 2012-10-16 ENCOUNTER — Other Ambulatory Visit: Payer: Self-pay | Admitting: Internal Medicine

## 2012-10-16 DIAGNOSIS — R7401 Elevation of levels of liver transaminase levels: Secondary | ICD-10-CM

## 2012-10-17 ENCOUNTER — Other Ambulatory Visit: Payer: Self-pay

## 2012-10-19 ENCOUNTER — Ambulatory Visit
Admission: RE | Admit: 2012-10-19 | Discharge: 2012-10-19 | Disposition: A | Discharge status: Home / Self Care | Payer: BC Managed Care – PPO | Source: Ambulatory Visit | Attending: Internal Medicine | Admitting: Internal Medicine

## 2012-10-19 DIAGNOSIS — R7401 Elevation of levels of liver transaminase levels: Secondary | ICD-10-CM

## 2014-10-30 ENCOUNTER — Telehealth: Payer: Self-pay | Admitting: Internal Medicine

## 2014-11-11 ENCOUNTER — Encounter: Payer: Self-pay | Admitting: Internal Medicine

## 2014-12-30 NOTE — Telephone Encounter (Signed)
Pt scheduled  

## 2015-01-08 ENCOUNTER — Ambulatory Visit (AMBULATORY_SURGERY_CENTER): Payer: Self-pay

## 2015-01-08 VITALS — Ht 71.0 in | Wt 259.4 lb

## 2015-01-08 DIAGNOSIS — Z860100 Personal history of colon polyps, unspecified: Secondary | ICD-10-CM

## 2015-01-08 NOTE — Progress Notes (Signed)
No allergies to eggs or soy No diet/weight loss meds No home oxygen No past problems with anesthesia  Has email  Emmi instructions given for colonoscopy 

## 2015-01-23 ENCOUNTER — Ambulatory Visit (AMBULATORY_SURGERY_CENTER): Payer: 59 | Admitting: Internal Medicine

## 2015-01-23 ENCOUNTER — Encounter: Payer: Self-pay | Admitting: Internal Medicine

## 2015-01-23 VITALS — BP 124/78 | HR 58 | Temp 97.4°F | Resp 31 | Ht 71.0 in | Wt 259.0 lb

## 2015-01-23 DIAGNOSIS — D123 Benign neoplasm of transverse colon: Secondary | ICD-10-CM

## 2015-01-23 DIAGNOSIS — Z86010 Personal history of colonic polyps: Secondary | ICD-10-CM

## 2015-01-23 MED ORDER — SODIUM CHLORIDE 0.9 % IV SOLN: 500.0000 mL | INTRAVENOUS | Status: DC

## 2015-01-23 NOTE — Progress Notes (Signed)
No problems noted in the recovery room. maw 

## 2015-01-23 NOTE — Progress Notes (Signed)
A/ox3 pleased with MAC, report to Karen RN 

## 2015-01-23 NOTE — Patient Instructions (Signed)
YOU HAD AN ENDOSCOPIC PROCEDURE TODAY AT THE [facility]: Refer to the procedure report that was given to you for any specific questions about what was found during the examination.  If the procedure report does not answer your questions, please call your gastroenterologist to clarify.  If you requested that your care partner not be given the details of your procedure findings, then the procedure report has been included in a sealed envelope for you to review at your convenience later.  YOU SHOULD EXPECT: Some feelings of bloating in the abdomen. Passage of more gas than usual.  Walking can help get rid of the air that was put into your GI tract during the procedure and reduce the bloating. If you had a lower endoscopy (such as a colonoscopy or flexible sigmoidoscopy) you may notice spotting of blood in your stool or on the toilet paper. If you underwent a bowel prep for your procedure, then you may not have a normal bowel movement for a few days.  DIET: Your first meal following the procedure should be a light meal and then it is ok to progress to your normal diet.  A half-sandwich or bowl of soup is an example of a good first meal.  Heavy or fried foods are harder to digest and may make you feel nauseous or bloated.  Likewise meals heavy in dairy and vegetables can cause extra gas to form and this can also increase the bloating.  Drink plenty of fluids but you should avoid alcoholic beverages for 24 hours.  ACTIVITY: Your care partner should take you home directly after the procedure.  You should plan to take it easy, moving slowly for the rest of the day.  You can resume normal activity the day after the procedure however you should NOT DRIVE or use heavy machinery for 24 hours (because of the sedation medicines used during the test).    SYMPTOMS TO REPORT IMMEDIATELY: A gastroenterologist can be reached at any hour.  During normal business hours, 8:30 AM to 5:00 PM Monday through Friday,  call [phone].  After hours and on weekends, please call the GI answering service at [phone] who will take a message and have the physician on call contact you.   Following lower endoscopy (colonoscopy or flexible sigmoidoscopy):  Excessive amounts of blood in the stool  Significant tenderness or worsening of abdominal pains  Swelling of the abdomen that is new, acute  Fever of 100F or higher    FOLLOW UP: If any biopsies were taken you will be contacted by phone or by letter within the next 1-3 weeks.  Call your gastroenterologist if you have not heard about the biopsies in 3 weeks.  Our staff will call the home number listed on your records the next business day following your procedure to check on you and address any questions or concerns that you may have at that time regarding the information given to you following your procedure. This is a courtesy call and so if there is no answer at the home number and we have not heard from you through the emergency physician on call, we will assume that you have returned to your regular daily activities without incident.  SIGNATURES/CONFIDENTIALITY: You and/or your care partner have signed paperwork which will be entered into your electronic medical record.  These signatures attest to the fact that that the information above on your After Visit Summary has been reviewed and is understood.  Full responsibility of the confidentiality   of this discharge information lies with you and/or your care-partner.     

## 2015-01-23 NOTE — Op Note (Signed)
[facility]  [address] [place], [address]   COLONOSCOPY PROCEDURE REPORT  PATIENT: Lawrence Johnson, Lawrence Johnson  MR#: [id number] BIRTHDATE: [birth year] , 63  yrs. old GENDER: male ENDOSCOPIST: Gatha Mayer, MD, [facility] PROCEDURE DATE:  01/23/2015 PROCEDURE:   Colonoscopy with biopsy First Screening Colonoscopy - Avg.  risk and is 50 yrs.  old or older - No.  Prior Negative Screening - Now for repeat screening. N/A  History of Adenoma - Now for follow-up colonoscopy & has been > or = to 3 yrs.  Yes hx of adenoma.  Has been 3 or more years since last colonoscopy.  Polyps Removed Today? Yes. ASA CLASS:   Class II INDICATIONS:surveillance colonoscopy based on a history of adenomatous colonic polyp(s). MEDICATIONS: Propofol 250 mg IV and Monitored anesthesia care  DESCRIPTION OF PROCEDURE:   After the risks benefits and alternatives of the procedure were thoroughly explained, informed consent was obtained.  The digital rectal exam revealed no abnormalities of the rectum and revealed no prostatic nodules. The LB CF-H180AL Loaner [id number]  endoscope was introduced through the anus and advanced to the cecum, which was identified by both the appendix and ileocecal valve. No adverse events experienced. The quality of the prep was excellent, using MiraLax  The instrument was then slowly withdrawn as the colon was fully examined.      COLON FINDINGS: 1) Two 2-3 mm transverse colon polyps removed with cold forceps and sent to pathology. 2) Otherwise normal colonoscopy in man with prior 3 mm adenoma removed 2010.  Retroflexed views revealed no abnormalities. The time to cecum=2 minutes 08 seconds. Withdrawal time=12 minutes 15 seconds.  The scope was withdrawn and the procedure completed. COMPLICATIONS: There were no immediate complications.  ENDOSCOPIC IMPRESSION: 1) Two 2-3 mm transverse colon polyps removed with cold forceps and sent to pathology. 2) Otherwise normal  colonoscopy in man with prior 3 mm adenoma removed 2010  RECOMMENDATIONS: Timing of repeat colonoscopy will be determined by pathology findings. Should be at least 5 years from now.  eSigned:  Gatha Mayer, MD, [facility] 01/23/2015 11:53 AM   cc: [name], MD and The Patient

## 2015-01-23 NOTE — Progress Notes (Signed)
Called to room to assist during endoscopic procedure.  Patient ID and intended procedure confirmed with present staff. Received instructions for my participation in the procedure from the performing physician.  

## 2015-01-24 ENCOUNTER — Telehealth: Payer: Self-pay | Admitting: *Deleted

## 2015-01-24 NOTE — Telephone Encounter (Signed)
  Follow up Call-  Call back number 01/23/2015  Post procedure Call Back phone  # [phone]  Permission to leave phone message Yes     Patient questions:  Do you have a fever, pain , or abdominal swelling? No. Pain Score  0 *  Have you tolerated food without any problems? Yes.    Have you been able to return to your normal activities? Yes.    Do you have any questions about your discharge instructions: Diet   No. Medications  No. Follow up visit  No.  Do you have questions or concerns about your Care? No.  Actions: * If pain score is 4 or above: No action needed, pain <4.

## 2015-01-30 ENCOUNTER — Encounter: Payer: Self-pay | Admitting: Internal Medicine

## 2015-01-30 NOTE — Progress Notes (Signed)
Quick Note:  Diminutive adenoma- repeat colon 2021 ______

## 2015-02-04 ENCOUNTER — Telehealth: Payer: Self-pay | Admitting: Internal Medicine

## 2015-02-05 NOTE — Telephone Encounter (Signed)
Patient's wife notified. I reviewed the results and answered all questions.    Letter should arrive probably today

## 2015-11-24 ENCOUNTER — Telehealth: Payer: Self-pay | Admitting: Internal Medicine

## 2015-11-24 NOTE — Telephone Encounter (Signed)
Patient notified no earlier appts at this time.  He is scheduled for 01/20/16.  He has [employer].  He is advised he should contact Dr,. Virgina Jock and make sure he gets a referral prior to the appt.

## 2016-01-01 ENCOUNTER — Encounter: Payer: 59 | Attending: Internal Medicine | Admitting: *Deleted

## 2016-01-01 ENCOUNTER — Encounter: Payer: Self-pay | Admitting: *Deleted

## 2016-01-01 VITALS — Ht 70.0 in | Wt 261.7 lb

## 2016-01-01 DIAGNOSIS — E669 Obesity, unspecified: Secondary | ICD-10-CM | POA: Diagnosis present

## 2016-01-01 DIAGNOSIS — Z713 Dietary counseling and surveillance: Secondary | ICD-10-CM | POA: Diagnosis not present

## 2016-01-01 DIAGNOSIS — Z6837 Body mass index (BMI) 37.0-37.9, adult: Secondary | ICD-10-CM | POA: Insufficient documentation

## 2016-01-01 DIAGNOSIS — E7800 Pure hypercholesterolemia, unspecified: Secondary | ICD-10-CM | POA: Insufficient documentation

## 2016-01-01 NOTE — Patient Instructions (Signed)
Plan:  Aim for 3 Carb Choices per meal (45 grams) +/- 1 either way  Aim for 0-2 Carbs per snack if hungry  Include protein in moderation with your meals and snacks Consider reading food labels for Total Carbohydrate of foods Consider  increasing your activity level daily as tolerated

## 2016-01-01 NOTE — Progress Notes (Signed)
  Medical Nutrition Therapy:  Appt start time: 0800 end time:  0900.  Assessment:  Primary concerns today: patient has been told he has pre-diabetes and he would like to lose some weight and learn more about postponing a diagnosis of diabetes. He works full time Theatre manager, he is married and his wife has Crohn's Disease so their eating habits can be different. He was active in his youth with marshal arts, basketball, swimming and other activities. He is not active now.  Preferred Learning Style: Auditory  Learning Readiness:   Ready  Change in progress  MEDICATIONS: see list   DIETARY INTAKE:  Usual eating pattern includes 3 meals and 1 snacks per day.  Everyday foods include good variety of all food groups.  Avoided foods include fatty foods.    24-hr recall:  B ( AM): Kashi cereal with Almond milk and banana OR High Protein Smoothie   Snk ( AM): no  L ( PM): sandwich with 40 calorie bread, 4 oz meat, cheese, mustard  Snk ( PM): no D ( PM): lean meat, steamed vegetables, small serving starch Or eats out 2/week and splits entree with wife Snk ( PM): baked Scoops, salsa OR sunflower seeds OR banana Beverages: coffee with [place] and creamer, water, occasionally a beer or white wine  Usual physical activity: none Estimated energy needs: 1600 calories 180 g carbohydrates 120 g protein 44 g fat  Progress Towards Goal(s):  In progress.   Nutritional Diagnosis:  NI-1.5 Excessive energy intake As related to activity level.  As evidenced by BMI of 37.6.    Intervention:  Nutrition counseling and pre-diabetes education initiated. Discussed Carb Counting by food group as method of portion control, reading food labels, and benefits of increased activity. Also discussed role of saturated fats on cholesterol levels and benefits of substituting unsaturated fats.  Teaching Method Utilized: Visual, Auditory andHands on  Handouts given during visit include: Living Well with  Diabetes Carb Counting and Food Label handouts Meal Plan Card Snack List  Saturated and Unsaturated Fat list  Barriers to learning/adherence to lifestyle change: none  Demonstrated degree of understanding via:  Teach Back   Monitoring/Evaluation:  Dietary intake, exercise, reading food labels, and body weight in 3 month(s).

## 2016-01-20 ENCOUNTER — Other Ambulatory Visit (INDEPENDENT_AMBULATORY_CARE_PROVIDER_SITE_OTHER): Payer: 59

## 2016-01-20 ENCOUNTER — Ambulatory Visit (INDEPENDENT_AMBULATORY_CARE_PROVIDER_SITE_OTHER): Payer: 59 | Admitting: Internal Medicine

## 2016-01-20 ENCOUNTER — Encounter: Payer: Self-pay | Admitting: Internal Medicine

## 2016-01-20 VITALS — BP 128/80 | HR 68 | Ht 70.0 in | Wt 259.0 lb

## 2016-01-20 DIAGNOSIS — R1084 Generalized abdominal pain: Secondary | ICD-10-CM

## 2016-01-20 DIAGNOSIS — R739 Hyperglycemia, unspecified: Secondary | ICD-10-CM

## 2016-01-20 DIAGNOSIS — R140 Abdominal distension (gaseous): Secondary | ICD-10-CM

## 2016-01-20 LAB — AMYLASE: Amylase: 28 U/L (ref 27–131)

## 2016-01-20 LAB — COMPREHENSIVE METABOLIC PANEL
ALT: 102 U/L — AB (ref 0–53)
AST: 75 U/L — ABNORMAL HIGH (ref 0–37)
Albumin: 4.4 g/dL (ref 3.5–5.2)
Alkaline Phosphatase: 60 U/L (ref 39–117)
BILIRUBIN TOTAL: 0.6 mg/dL (ref 0.2–1.2)
BUN: 23 mg/dL (ref 6–23)
CHLORIDE: 96 meq/L (ref 96–112)
CO2: 26 meq/L (ref 19–32)
CREATININE: 0.95 mg/dL (ref 0.40–1.50)
Calcium: 9.7 mg/dL (ref 8.4–10.5)
GFR: 85.35 mL/min (ref >60.00)
Glucose, Bld: 411 mg/dL — ABNORMAL HIGH (ref 70–99)
Potassium: 4.8 mEq/L (ref 3.5–5.1)
Sodium: 132 mEq/L — ABNORMAL LOW (ref 135–145)
Total Protein: 7.6 g/dL (ref 6.0–8.3)

## 2016-01-20 LAB — CBC WITH DIFFERENTIAL/PLATELET
BASOS PCT: 0.6 % (ref 0.0–3.0)
Basophils Absolute: 0 10*3/uL (ref 0.0–0.1)
Eosinophils Absolute: 0.2 10*3/uL (ref 0.0–0.7)
Eosinophils Relative: 2.9 % (ref 0.0–5.0)
HEMATOCRIT: 45.3 % (ref 39.0–52.0)
Hemoglobin: 15.4 g/dL (ref 13.0–17.0)
Lymphocytes Relative: 22.6 % (ref 12.0–46.0)
Lymphs Abs: 1.5 10*3/uL (ref 0.7–4.0)
MCHC: 34 g/dL (ref 30.0–36.0)
MCV: 87 fl (ref 78.0–100.0)
MONOS PCT: 6 % (ref 3.0–12.0)
Monocytes Absolute: 0.4 10*3/uL (ref 0.1–1.0)
NEUTROS ABS: 4.4 10*3/uL (ref 1.4–7.7)
Neutrophils Relative %: 67.9 % (ref 43.0–77.0)
PLATELETS: 155 10*3/uL (ref 150.0–400.0)
RBC: 5.2 Mil/uL (ref 4.22–5.81)
RDW: 13.4 % (ref 11.5–15.5)
WBC: 6.4 10*3/uL (ref 4.0–10.5)

## 2016-01-20 LAB — LIPASE: Lipase: 33 U/L (ref 11.0–59.0)

## 2016-01-20 LAB — HEMOGLOBIN A1C: HEMOGLOBIN A1C: 8.2 % — AB (ref 4.6–6.5)

## 2016-01-20 NOTE — Progress Notes (Signed)
Quick Note:  Blood sugar is 411 Needs to get to his PCP very soon (today/tomorrow) to have this treated vs going to an urgent care. Other labs ok/slightly abnl  Please let him know and also see if we can facilitate an urgent appt. This could be source of his problems - CT will tell us more.  Need to fax the labs also ______

## 2016-01-20 NOTE — Progress Notes (Signed)
Subjective:    Patient ID: Lawrence Johnson, male    DOB: [birth year], 65 y.o.   MRN: [id number]  Cc: abdominal pain HPI Patient is a 65 yo male with a PMH of fatty liver disease, HTN, and presents with a 3 mo history of generalized abdominal pain that he describes as "waves/cramps" moving through his LUQ and RLQ areas after eating.When the pain first started, he had subjective fevers and chills that lasted for 3 weeks. He also states that he feels more bloated that normal. There is no change in bowel habits and he denies constipation/diarrhea. Patient recently started a probiotic and he states that has helped his abdominal pain some. 1 month ago he slipped on the ice and was taking Advil 400mg  QID for 2 weeks. Otherwise no change in medications.  Labs from his PCP show elevated LFTs and a glucose of 276. He had an abdominal U/S 6/16 showing fatty liver disease.   Medications, allergies, past medical history, past surgical history, family history and social history are reviewed and updated in the EMR.   Review of Systems  Constitutional: Positive for fatigue. Negative for appetite change and unexpected weight change.  Gastrointestinal: Positive for abdominal pain and abdominal distention. Negative for nausea, vomiting, diarrhea, constipation and blood in stool.  Musculoskeletal:       Muscle cramps in proximal lower extremities B/L   All other systems reviewed and are negative.      Objective:   Physical Exam  Filed Vitals:   01/20/16 0930  BP: 128/80  Pulse: 68    Constitutional: Appears well-developed and well-nourished. Obese HENT:  Head: Normocephalic and atraumatic.  Eyes: No scleral icterus.  Neck: No tracheal deviation present.  Cardiovascular: Normal rate, regular rhythm and normal heart sounds.  No murmurs, rubs, or gallops Abdominal: Soft. Bowel sounds are normal. Patient exhibits no distension, no tenderness and no mass. There is no rebound and no guarding. Very obese  abdomen  Neurological: Patient is alert and oriented to person, place, and time.  Skin: Skin is warm and dry. No peripheral edema.  Psychiatric: Patient has a normal mood and affect. The behavior is normal. Judgment and thought content normal.  Data reviewed: 10/2015 PCP note and labs - glucose 276, abnormal transaminases, NL CBC and as per HPI Wt Readings from Last 3 Encounters:  01/20/16 259 lb (117.482 kg)  01/01/16 261 lb 11.2 oz (118.706 kg)  01/23/15 259 lb (117.482 kg)       Assessment & Plan:  Generalized abdominal pain  Bloating  Hyperglycemia   CBC, CMET, amylase, lipase, Hbg A1C CT abd/pelvis w/ contrast will be ordered to better understand this process. Question if it's from the pancreas, or functional GI disturbance. He could need an upper endoscopy  No change in any medications at this time, will wait for labs and imaging before deciding any further treatment.  The hyperglycemia could be playing a factor in the abdominal pain and with glucose control he may start feeling better.  CT will check for any masses, especially in the pancreas, because pancreatic cancer can trigger diabetes mellitus.  Elevated LFTs with known fatty liver disease.   I've seen the patient with Foye Spurling PA student, she served as a Education administrator for this visit  I appreciate the opportunity to care for this patient. CC: Precious Reel, MD    Chemistry      Component Value Date/Time   NA 132* 01/20/2016 1022   K 4.8 01/20/2016 1022  CL 96 01/20/2016 1022   CO2 26 01/20/2016 1022   BUN 23 01/20/2016 1022   CREATININE 0.95 01/20/2016 1022      Component Value Date/Time   CALCIUM 9.7 01/20/2016 1022   ALKPHOS 60 01/20/2016 1022   AST 75* 01/20/2016 1022   ALT 102* 01/20/2016 1022   BILITOT 0.6 01/20/2016 1022     Lab Results  Component Value Date   HGBA1C 8.2* 01/20/2016   Lab Results  Component Value Date   WBC 6.4 01/20/2016   HGB 15.4 01/20/2016   HCT 45.3 01/20/2016   MCV  87.0 01/20/2016   PLT 155.0 01/20/2016   Lab Results  Component Value Date   LIPASE 33.0 01/20/2016   Lab Results  Component Value Date   AMYLASE 28 01/20/2016    He will be referred to see PCP urgently re: glucose 411.

## 2016-01-20 NOTE — Patient Instructions (Addendum)
  Your physician has requested that you go to the basement for lab work before leaving today.   You have been scheduled for a CT scan of the abdomen and pelvis at [facility] ([address]---this is in the same building as Press [facility]).   You are scheduled on 01/21/16 at 2:30pm. You should arrive 15 minutes prior to your appointment time for registration. Please follow the written instructions below on the day of your exam:  WARNING: IF YOU ARE ALLERGIC TO IODINE/X-RAY DYE, PLEASE NOTIFY RADIOLOGY IMMEDIATELY AT [phone]! YOU WILL BE GIVEN A 13 HOUR PREMEDICATION PREP.  1) Do not eat or drink anything after 10:30AM (4 hours prior to your test) 2) You have been given 2 bottles of oral contrast to drink. The solution may taste   better if refrigerated, but do NOT add ice or any other liquid to this solution. Shake  well before drinking.    Drink 1 bottle of contrast @ 12:30pm (2 hours prior to your exam)  Drink 1 bottle of contrast @ 1:30pm (1 hour prior to your exam)  You may take any medications as prescribed with a small amount of water except for the following: Metformin, Glucophage, Glucovance, Avandamet, Riomet, Fortamet, Actoplus Met, Janumet, Glumetza or Metaglip. The above medications must be held the day of the exam AND 48 hours after the exam.  The purpose of you drinking the oral contrast is to aid in the visualization of your intestinal tract. The contrast solution may cause some diarrhea. Before your exam is started, you will be given a small amount of fluid to drink. Depending on your individual set of symptoms, you may also receive an intravenous injection of x-ray contrast/dye. Plan on being at [facility] for 30 minutes or longer, depending on the type of exam you are having performed.  This test typically takes 30-45 minutes to complete.   If you have any questions regarding your exam or if you need to reschedule, you may call the CT department  at [phone] between the hours of 8:00 am and 5:00 pm, Monday-Friday.  ________________________________________________________________________   I appreciate the opportunity to care for you. Silvano Rusk, MD, [facility]

## 2016-01-21 ENCOUNTER — Ambulatory Visit (INDEPENDENT_AMBULATORY_CARE_PROVIDER_SITE_OTHER)
Admission: RE | Admit: 2016-01-21 | Discharge: 2016-01-21 | Disposition: A | Discharge status: Home / Self Care | Payer: 59 | Source: Ambulatory Visit | Attending: Internal Medicine | Admitting: Internal Medicine

## 2016-01-21 DIAGNOSIS — R1084 Generalized abdominal pain: Secondary | ICD-10-CM | POA: Diagnosis not present

## 2016-01-21 MED ORDER — IOHEXOL 300 MG/ML  SOLN
100.0000 mL | Freq: Once | INTRAMUSCULAR | Status: AC | PRN
  Administered 2016-01-21: 100 mL via INTRAVENOUS

## 2016-01-21 NOTE — Progress Notes (Signed)
Quick Note:  Let him know the scan does not show any major problems He has a fatty liver which is commonly seen when obese and diabetic but it is not causing hs sxs  My rec is to get on Tx for diabetes and if still feeling bad w/ GI sxs then return I will cc PCP ______

## 2016-01-23 ENCOUNTER — Encounter: Payer: Self-pay | Admitting: Internal Medicine

## 2017-04-12 ENCOUNTER — Other Ambulatory Visit: Payer: Self-pay | Admitting: Internal Medicine

## 2017-04-12 DIAGNOSIS — E1151 Type 2 diabetes mellitus with diabetic peripheral angiopathy without gangrene: Secondary | ICD-10-CM

## 2017-04-12 DIAGNOSIS — I70209 Unspecified atherosclerosis of native arteries of extremities, unspecified extremity: Principal | ICD-10-CM

## 2017-04-12 DIAGNOSIS — Z8249 Family history of ischemic heart disease and other diseases of the circulatory system: Secondary | ICD-10-CM

## 2017-04-12 DIAGNOSIS — I10 Essential (primary) hypertension: Secondary | ICD-10-CM

## 2017-04-19 ENCOUNTER — Ambulatory Visit
Admission: RE | Admit: 2017-04-19 | Discharge: 2017-04-19 | Disposition: A | Discharge status: Home / Self Care | Payer: No Typology Code available for payment source | Source: Ambulatory Visit | Attending: Internal Medicine | Admitting: Internal Medicine

## 2017-04-19 DIAGNOSIS — E1151 Type 2 diabetes mellitus with diabetic peripheral angiopathy without gangrene: Secondary | ICD-10-CM

## 2017-04-19 DIAGNOSIS — I70209 Unspecified atherosclerosis of native arteries of extremities, unspecified extremity: Principal | ICD-10-CM

## 2017-04-19 DIAGNOSIS — Z8249 Family history of ischemic heart disease and other diseases of the circulatory system: Secondary | ICD-10-CM

## 2017-04-19 DIAGNOSIS — I10 Essential (primary) hypertension: Secondary | ICD-10-CM

## 2017-08-24 ENCOUNTER — Other Ambulatory Visit: Payer: Self-pay | Admitting: Dermatology

## 2018-05-22 DIAGNOSIS — Z23 Encounter for immunization: Secondary | ICD-10-CM | POA: Diagnosis not present

## 2018-06-06 ENCOUNTER — Other Ambulatory Visit: Payer: Self-pay

## 2018-06-06 DIAGNOSIS — D485 Neoplasm of uncertain behavior of skin: Secondary | ICD-10-CM | POA: Diagnosis not present

## 2018-06-06 DIAGNOSIS — D1801 Hemangioma of skin and subcutaneous tissue: Secondary | ICD-10-CM | POA: Diagnosis not present

## 2018-06-06 DIAGNOSIS — L821 Other seborrheic keratosis: Secondary | ICD-10-CM | POA: Diagnosis not present

## 2018-06-06 DIAGNOSIS — L820 Inflamed seborrheic keratosis: Secondary | ICD-10-CM | POA: Diagnosis not present

## 2018-06-06 DIAGNOSIS — L570 Actinic keratosis: Secondary | ICD-10-CM | POA: Diagnosis not present

## 2018-06-06 DIAGNOSIS — D229 Melanocytic nevi, unspecified: Secondary | ICD-10-CM | POA: Diagnosis not present

## 2018-06-13 DIAGNOSIS — R740 Nonspecific elevation of levels of transaminase and lactic acid dehydrogenase [LDH]: Secondary | ICD-10-CM | POA: Diagnosis not present

## 2018-06-13 DIAGNOSIS — E1165 Type 2 diabetes mellitus with hyperglycemia: Secondary | ICD-10-CM | POA: Diagnosis not present

## 2018-06-13 DIAGNOSIS — E7849 Other hyperlipidemia: Secondary | ICD-10-CM | POA: Diagnosis not present

## 2018-06-13 DIAGNOSIS — I10 Essential (primary) hypertension: Secondary | ICD-10-CM | POA: Diagnosis not present

## 2018-08-20 DIAGNOSIS — Z23 Encounter for immunization: Secondary | ICD-10-CM | POA: Diagnosis not present

## 2018-09-16 DIAGNOSIS — Z23 Encounter for immunization: Secondary | ICD-10-CM | POA: Diagnosis not present

## 2018-10-03 ENCOUNTER — Encounter: Payer: Self-pay | Admitting: Cardiology

## 2018-10-03 DIAGNOSIS — R82998 Other abnormal findings in urine: Secondary | ICD-10-CM | POA: Diagnosis not present

## 2018-10-03 DIAGNOSIS — Z0000 Encounter for general adult medical examination without abnormal findings: Secondary | ICD-10-CM | POA: Diagnosis not present

## 2018-10-03 DIAGNOSIS — E1165 Type 2 diabetes mellitus with hyperglycemia: Secondary | ICD-10-CM | POA: Diagnosis not present

## 2018-10-03 DIAGNOSIS — I10 Essential (primary) hypertension: Secondary | ICD-10-CM | POA: Diagnosis not present

## 2018-10-10 DIAGNOSIS — Z0000 Encounter for general adult medical examination without abnormal findings: Secondary | ICD-10-CM | POA: Diagnosis not present

## 2018-10-10 DIAGNOSIS — E7849 Other hyperlipidemia: Secondary | ICD-10-CM | POA: Diagnosis not present

## 2018-10-10 DIAGNOSIS — E1165 Type 2 diabetes mellitus with hyperglycemia: Secondary | ICD-10-CM | POA: Diagnosis not present

## 2018-10-10 DIAGNOSIS — I10 Essential (primary) hypertension: Secondary | ICD-10-CM | POA: Diagnosis not present

## 2018-10-10 DIAGNOSIS — Z23 Encounter for immunization: Secondary | ICD-10-CM | POA: Diagnosis not present

## 2018-10-10 DIAGNOSIS — Z1389 Encounter for screening for other disorder: Secondary | ICD-10-CM | POA: Diagnosis not present

## 2018-10-17 DIAGNOSIS — H6122 Impacted cerumen, left ear: Secondary | ICD-10-CM | POA: Diagnosis not present

## 2018-10-17 DIAGNOSIS — H6062 Unspecified chronic otitis externa, left ear: Secondary | ICD-10-CM | POA: Diagnosis not present

## 2018-10-17 DIAGNOSIS — H903 Sensorineural hearing loss, bilateral: Secondary | ICD-10-CM | POA: Diagnosis not present

## 2018-10-20 ENCOUNTER — Encounter: Payer: Self-pay | Admitting: Cardiology

## 2018-10-20 ENCOUNTER — Ambulatory Visit: Payer: 59 | Admitting: Cardiology

## 2018-10-20 VITALS — BP 120/80 | HR 75 | Ht 70.0 in | Wt 254.0 lb

## 2018-10-20 DIAGNOSIS — E785 Hyperlipidemia, unspecified: Secondary | ICD-10-CM

## 2018-10-20 DIAGNOSIS — I10 Essential (primary) hypertension: Secondary | ICD-10-CM | POA: Diagnosis not present

## 2018-10-20 DIAGNOSIS — R931 Abnormal findings on diagnostic imaging of heart and coronary circulation: Secondary | ICD-10-CM | POA: Diagnosis not present

## 2018-10-20 DIAGNOSIS — E119 Type 2 diabetes mellitus without complications: Secondary | ICD-10-CM

## 2018-10-20 DIAGNOSIS — R079 Chest pain, unspecified: Secondary | ICD-10-CM | POA: Diagnosis not present
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
## 2018-10-20 NOTE — Patient Instructions (Signed)
Medication Instructions:  Continue current medications  If you need a refill on your cardiac medications before your next appointment, please call your pharmacy.  Labwork: None Ordered  If you have labs (blood work) drawn today and your tests are completely normal, you will receive your results only by: Marland Kitchen MyChart Message (if you have MyChart) OR . A paper copy in the mail If you have any lab test that is abnormal or we need to change your treatment, we will call you to review the results.  Testing/Procedures: Your physician has requested that you have an exercise tolerance test. For further information please visit [email or web address]. Please also follow instruction sheet, as given.  Follow-Up: You will need a follow up appointment in 1 Year.  Please call our office 2 months in advance([phone]) to schedule the (1 Year) appointment.  You may see  DR Percival Spanish or one of the following Advanced Practice Providers on your designated Care Team:   . Jory Sims, DNP, ANP . Rhonda Barrett, PA-C .  Marland Kitchen Kerin Ransom, PA-C . Daleen Snook Kroeger, PA-C . Sande Rives, PA-C .  Marland Kitchen Almyra Deforest, PA-C . Fabian Sharp, PA-C  At [facility], you and your health needs are our priority.  As part of our continuing mission to provide you with exceptional heart care, we have created designated Provider Care Teams.  These Care Teams include your primary Cardiologist (physician) and Advanced Practice Providers (APPs -  Physician Assistants and Nurse Practitioners) who all work together to provide you with the care you need, when you need it.   Thank you for choosing [facility] at [facility]!!

## 2018-10-31 ENCOUNTER — Telehealth (HOSPITAL_COMMUNITY): Payer: Self-pay

## 2018-10-31 NOTE — Telephone Encounter (Signed)
Encounter complete. 

## 2018-11-02 ENCOUNTER — Ambulatory Visit (HOSPITAL_COMMUNITY)
Admission: RE | Admit: 2018-11-02 | Discharge: 2018-11-02 | Disposition: A | Discharge status: Home / Self Care | Payer: 59 | Source: Ambulatory Visit | Attending: Cardiology | Admitting: Cardiology

## 2018-11-02 DIAGNOSIS — R079 Chest pain, unspecified: Secondary | ICD-10-CM | POA: Diagnosis not present

## 2018-11-02 LAB — EXERCISE TOLERANCE TEST
CHL RATE OF PERCEIVED EXERTION: 17
CSEPEDS: 27 s
CSEPEW: 10.1 METS
Exercise duration (min): 8 min
MPHR: 156 {beats}/min
Peak HR: 164 {beats}/min
Percent HR: 105 %
Rest HR: 75 {beats}/min

## 2018-11-13 DIAGNOSIS — H903 Sensorineural hearing loss, bilateral: Secondary | ICD-10-CM | POA: Diagnosis not present

## 2018-11-13 DIAGNOSIS — J320 Chronic maxillary sinusitis: Secondary | ICD-10-CM | POA: Diagnosis not present

## 2018-11-13 DIAGNOSIS — J322 Chronic ethmoidal sinusitis: Secondary | ICD-10-CM | POA: Diagnosis not present

## 2018-11-27 DIAGNOSIS — H6061 Unspecified chronic otitis externa, right ear: Secondary | ICD-10-CM | POA: Diagnosis not present

## 2018-11-27 DIAGNOSIS — H6121 Impacted cerumen, right ear: Secondary | ICD-10-CM | POA: Diagnosis not present

## 2018-11-27 DIAGNOSIS — J320 Chronic maxillary sinusitis: Secondary | ICD-10-CM | POA: Diagnosis not present

## 2018-12-25 ENCOUNTER — Other Ambulatory Visit: Payer: Self-pay | Admitting: Internal Medicine

## 2018-12-25 DIAGNOSIS — R7402 Elevation of levels of lactic acid dehydrogenase (LDH): Secondary | ICD-10-CM

## 2018-12-25 DIAGNOSIS — K760 Fatty (change of) liver, not elsewhere classified: Secondary | ICD-10-CM

## 2018-12-25 DIAGNOSIS — R7401 Elevation of levels of liver transaminase levels: Secondary | ICD-10-CM

## 2018-12-25 DIAGNOSIS — R740 Nonspecific elevation of levels of transaminase and lactic acid dehydrogenase [LDH]: Principal | ICD-10-CM

## 2019-01-01 ENCOUNTER — Ambulatory Visit
Admission: RE | Admit: 2019-01-01 | Discharge: 2019-01-01 | Disposition: A | Discharge status: Home / Self Care | Payer: 59 | Source: Ambulatory Visit | Attending: Internal Medicine | Admitting: Internal Medicine

## 2019-01-01 DIAGNOSIS — K760 Fatty (change of) liver, not elsewhere classified: Secondary | ICD-10-CM

## 2019-01-01 DIAGNOSIS — R7401 Elevation of levels of liver transaminase levels: Secondary | ICD-10-CM

## 2019-01-01 DIAGNOSIS — R740 Nonspecific elevation of levels of transaminase and lactic acid dehydrogenase [LDH]: Principal | ICD-10-CM

## 2019-01-01 DIAGNOSIS — K8020 Calculus of gallbladder without cholecystitis without obstruction: Secondary | ICD-10-CM | POA: Diagnosis not present

## 2019-02-15 DIAGNOSIS — E1165 Type 2 diabetes mellitus with hyperglycemia: Secondary | ICD-10-CM | POA: Diagnosis not present

## 2019-02-15 DIAGNOSIS — I10 Essential (primary) hypertension: Secondary | ICD-10-CM | POA: Diagnosis not present

## 2019-02-15 DIAGNOSIS — E7849 Other hyperlipidemia: Secondary | ICD-10-CM | POA: Diagnosis not present

## 2019-06-13 ENCOUNTER — Other Ambulatory Visit: Payer: Self-pay | Admitting: Internal Medicine

## 2019-06-13 DIAGNOSIS — R519 Headache, unspecified: Secondary | ICD-10-CM

## 2019-06-13 DIAGNOSIS — G8929 Other chronic pain: Secondary | ICD-10-CM

## 2019-07-18 ENCOUNTER — Other Ambulatory Visit: Payer: Self-pay | Admitting: Internal Medicine

## 2019-07-18 DIAGNOSIS — R519 Headache, unspecified: Secondary | ICD-10-CM

## 2019-07-20 ENCOUNTER — Ambulatory Visit
Admission: RE | Admit: 2019-07-20 | Discharge: 2019-07-20 | Disposition: A | Discharge status: Home / Self Care | Payer: PPO | Source: Ambulatory Visit | Attending: Internal Medicine | Admitting: Internal Medicine

## 2019-07-20 ENCOUNTER — Ambulatory Visit
Admission: RE | Admit: 2019-07-20 | Discharge: 2019-07-20 | Disposition: A | Discharge status: Home / Self Care | Payer: 59 | Source: Ambulatory Visit | Attending: Internal Medicine | Admitting: Internal Medicine

## 2019-07-20 DIAGNOSIS — R519 Headache, unspecified: Secondary | ICD-10-CM

## 2019-07-20 DIAGNOSIS — J341 Cyst and mucocele of nose and nasal sinus: Secondary | ICD-10-CM | POA: Diagnosis not present

## 2019-07-20 DIAGNOSIS — G8929 Other chronic pain: Secondary | ICD-10-CM

## 2019-07-20 DIAGNOSIS — J3489 Other specified disorders of nose and nasal sinuses: Secondary | ICD-10-CM | POA: Diagnosis not present

## 2019-07-25 DIAGNOSIS — J309 Allergic rhinitis, unspecified: Secondary | ICD-10-CM | POA: Diagnosis not present

## 2019-07-25 DIAGNOSIS — H6692 Otitis media, unspecified, left ear: Secondary | ICD-10-CM | POA: Diagnosis not present

## 2019-07-25 DIAGNOSIS — I10 Essential (primary) hypertension: Secondary | ICD-10-CM | POA: Diagnosis not present

## 2019-08-19 IMAGING — US US ABDOMEN COMPLETE
1 series · 13 of 25 positions shown · non-contrast
Comparison: None.

CLINICAL DATA: Elevated liver function tests.

EXAM:
ABDOMEN ULTRASOUND COMPLETE

[Series 1: us abdomen complete · 0.30mm/px · 13 of 102 slices shown]
[im 1/102]
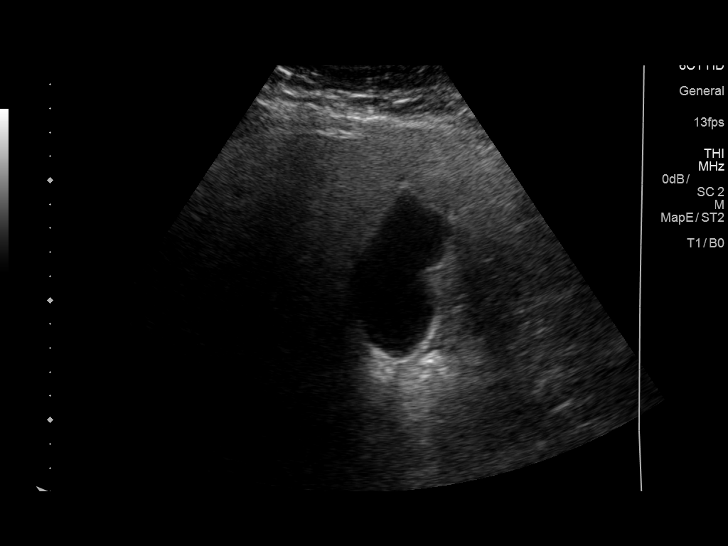
[im 9/102]
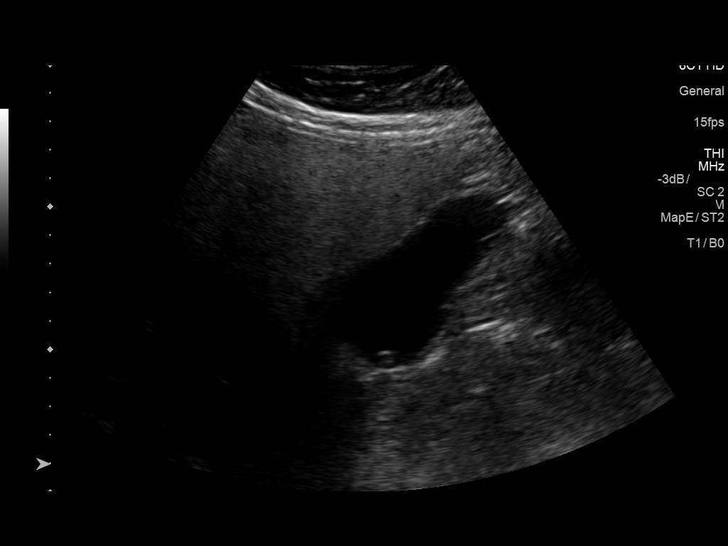
[im 17/102]
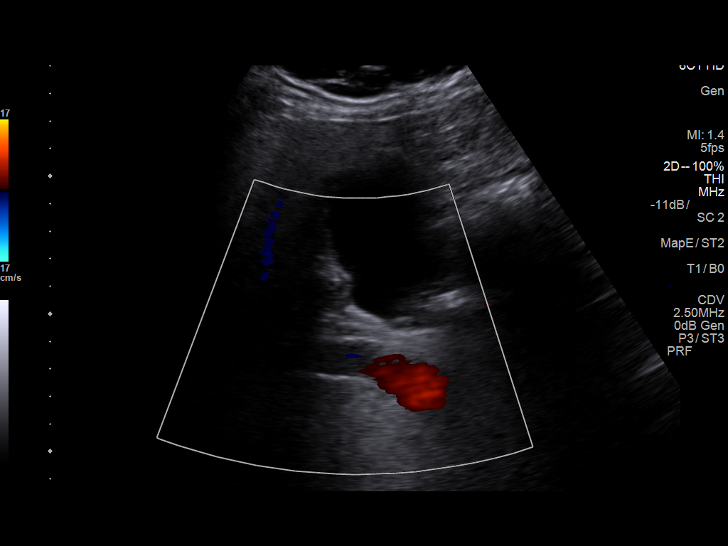
[im 26/102]
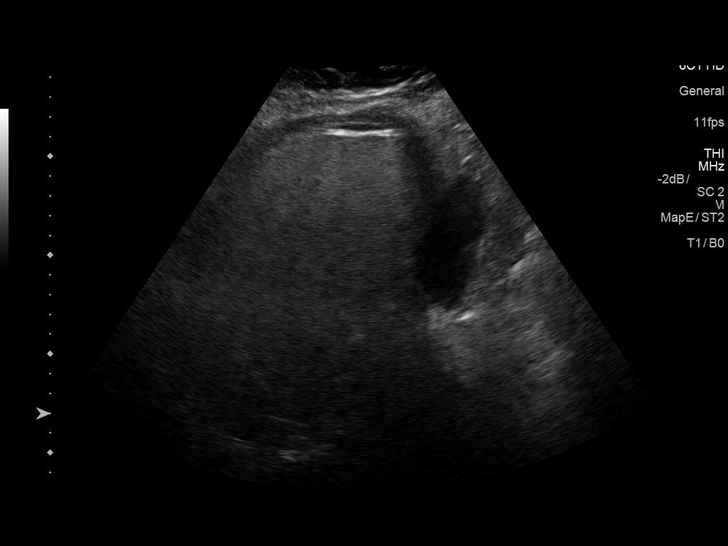
[im 34/102]
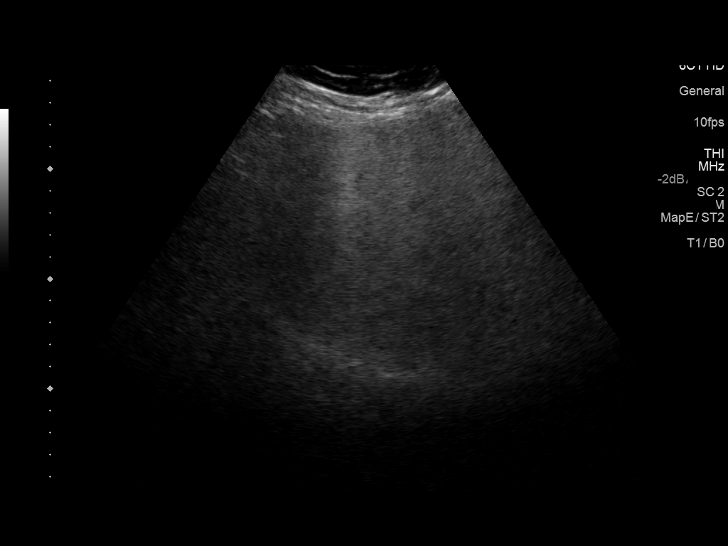
[im 43/102]
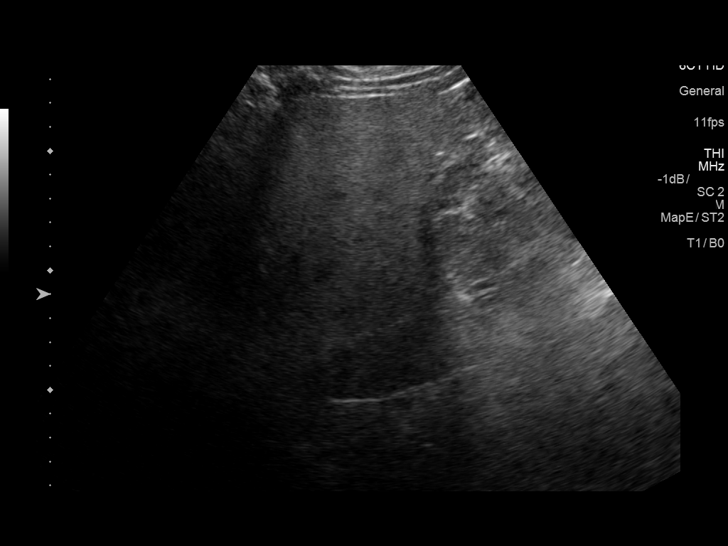
[im 51/102]
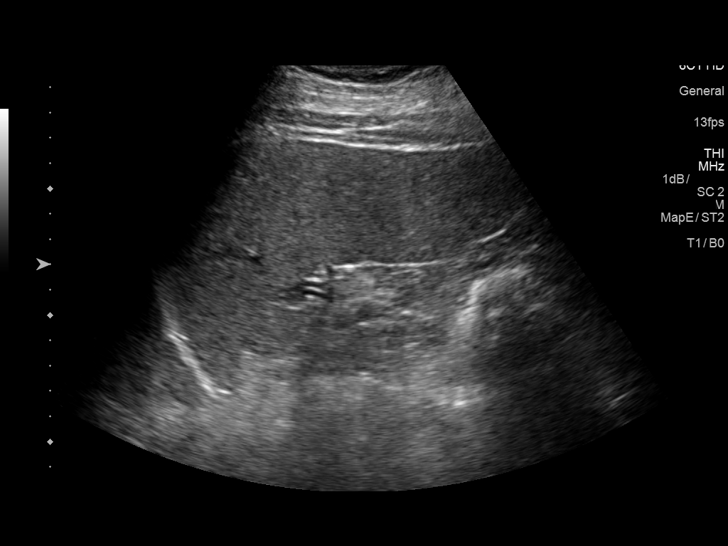
[im 59/102]
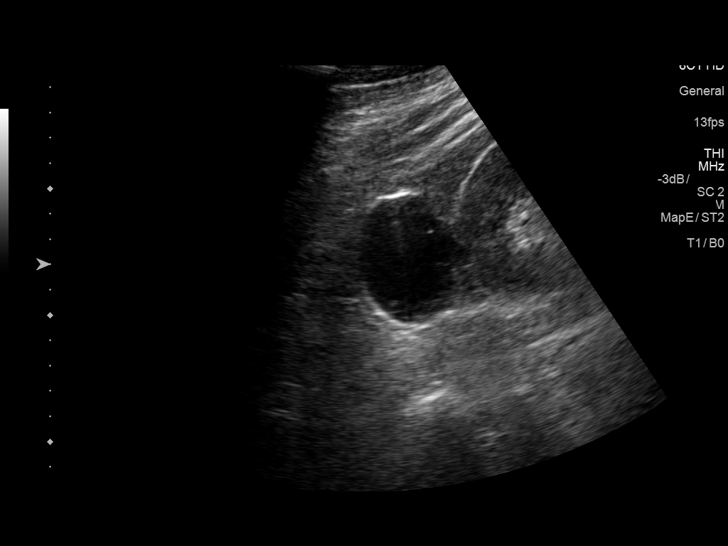
[im 68/102]
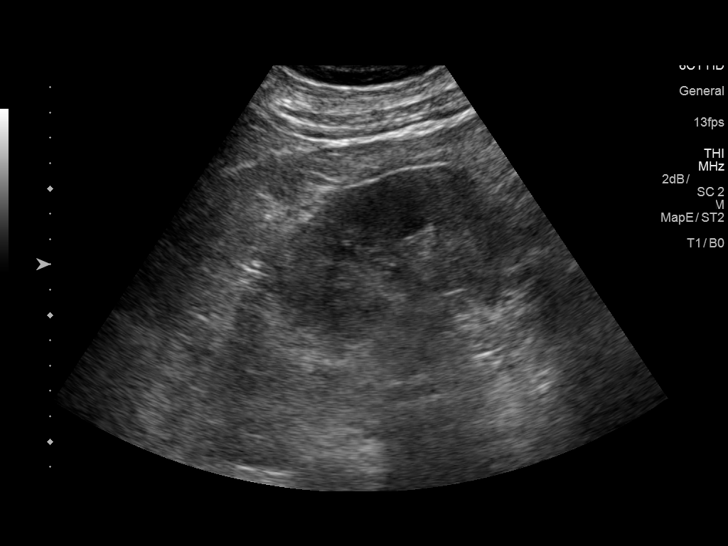
[im 76/102]
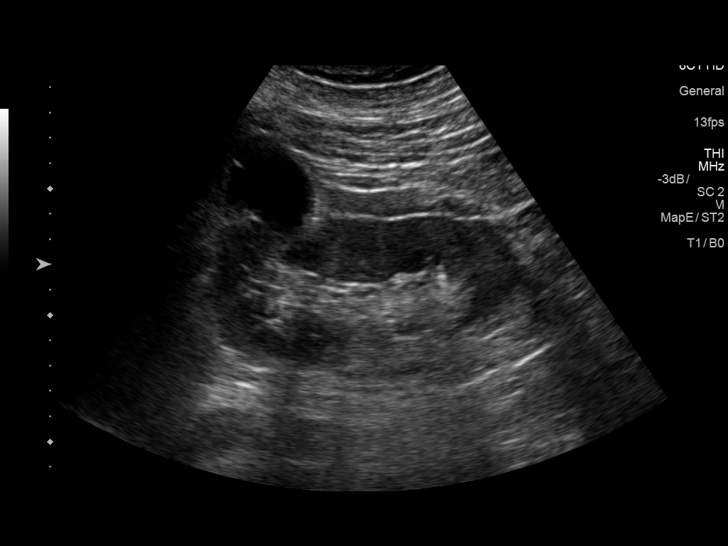
[im 85/102]
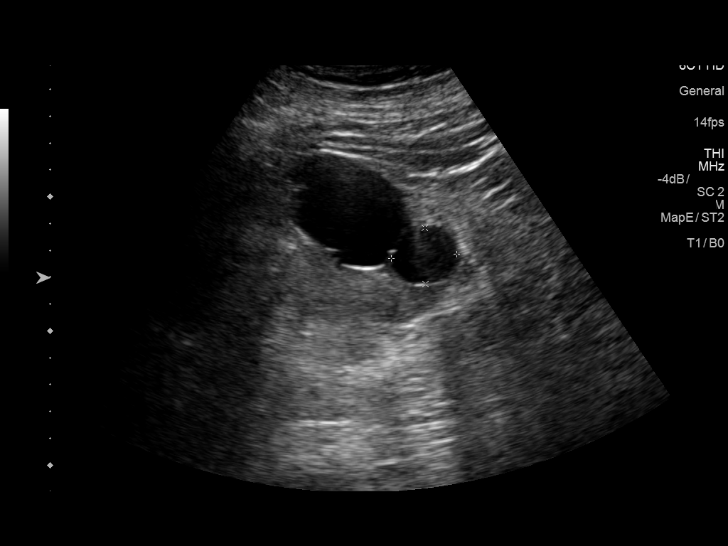
[im 93/102]
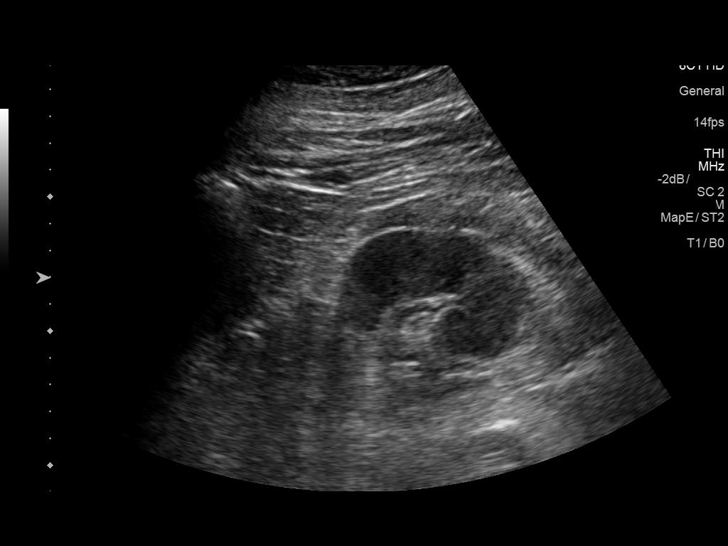
[im 102/102]
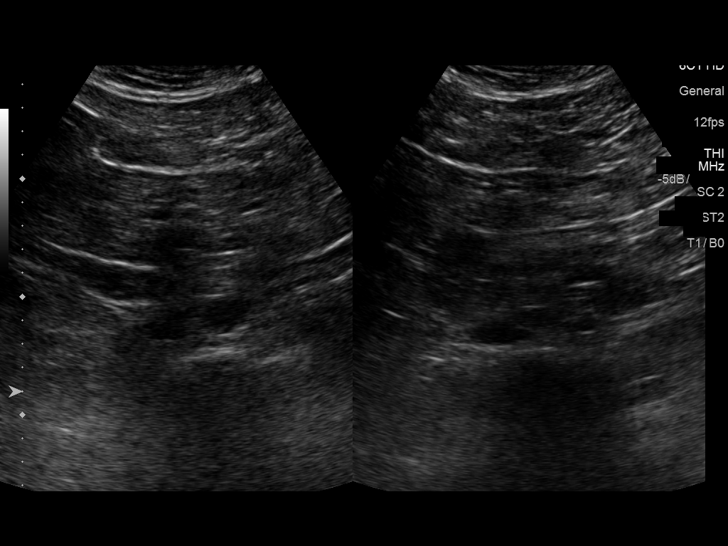

[13 of 25 positions shown; findings below may reference images not displayed]

FINDINGS: Gallbladder: There is a single small stone in the gallbladder.
Gallbladder wall is not thickened. No sonographic Murphy sign noted
by sonographer.

Common bile duct: Diameter: 4.1 mm, normal.

Liver: Diffuse increased echogenicity of the renal parenchyma
consistent with hepatic steatosis. No focal lesions. Portal vein is
patent on color Doppler imaging with normal direction of blood flow
towards the liver.

IVC: No abnormality visualized.

Pancreas: Visualized portion unremarkable.

Spleen: Size and appearance within normal limits.

Right Kidney: Length: 12.5 cm. Echogenicity within normal limits. No
solid mass or hydronephrosis visualized. 4.7 cm cyst on the upper
pole.

Left Kidney: Length: 13.1 cm. Echogenicity within normal limits. No
solid mass or hydronephrosis visualized. Two adjacent cysts
measuring approximately 4.6 cm and 3.2 cm in the mid kidney.

Abdominal aorta: No aneurysm visualized.

Other findings: None.
IMPRESSION: 1. Single tiny gallstone as demonstrated on the CT scan of
01/21/2016.
2. Hepatic steatosis.
3. Benign-appearing bilateral renal cysts as demonstrated on the
prior CT scan.

## 2019-09-13 DIAGNOSIS — H6692 Otitis media, unspecified, left ear: Secondary | ICD-10-CM | POA: Diagnosis not present

## 2019-09-13 DIAGNOSIS — H903 Sensorineural hearing loss, bilateral: Secondary | ICD-10-CM | POA: Diagnosis not present

## 2019-09-13 DIAGNOSIS — H93A3 Pulsatile tinnitus, bilateral: Secondary | ICD-10-CM | POA: Diagnosis not present

## 2019-09-13 DIAGNOSIS — R0981 Nasal congestion: Secondary | ICD-10-CM | POA: Diagnosis not present

## 2019-09-14 DIAGNOSIS — Z23 Encounter for immunization: Secondary | ICD-10-CM | POA: Diagnosis not present

## 2019-10-08 DIAGNOSIS — E291 Testicular hypofunction: Secondary | ICD-10-CM | POA: Diagnosis not present

## 2019-10-08 DIAGNOSIS — E1165 Type 2 diabetes mellitus with hyperglycemia: Secondary | ICD-10-CM | POA: Diagnosis not present

## 2019-10-08 DIAGNOSIS — E7849 Other hyperlipidemia: Secondary | ICD-10-CM | POA: Diagnosis not present

## 2019-10-08 DIAGNOSIS — Z125 Encounter for screening for malignant neoplasm of prostate: Secondary | ICD-10-CM | POA: Diagnosis not present

## 2019-10-15 DIAGNOSIS — E785 Hyperlipidemia, unspecified: Secondary | ICD-10-CM | POA: Diagnosis not present

## 2019-10-15 DIAGNOSIS — R7401 Elevation of levels of liver transaminase levels: Secondary | ICD-10-CM | POA: Diagnosis not present

## 2019-10-15 DIAGNOSIS — G4700 Insomnia, unspecified: Secondary | ICD-10-CM | POA: Diagnosis not present

## 2019-10-15 DIAGNOSIS — H9312 Tinnitus, left ear: Secondary | ICD-10-CM | POA: Diagnosis not present

## 2019-10-15 DIAGNOSIS — I10 Essential (primary) hypertension: Secondary | ICD-10-CM | POA: Diagnosis not present

## 2019-10-15 DIAGNOSIS — E1165 Type 2 diabetes mellitus with hyperglycemia: Secondary | ICD-10-CM | POA: Diagnosis not present

## 2019-10-15 DIAGNOSIS — Z23 Encounter for immunization: Secondary | ICD-10-CM | POA: Diagnosis not present

## 2019-10-15 DIAGNOSIS — Z8249 Family history of ischemic heart disease and other diseases of the circulatory system: Secondary | ICD-10-CM | POA: Diagnosis not present

## 2019-10-15 DIAGNOSIS — G4733 Obstructive sleep apnea (adult) (pediatric): Secondary | ICD-10-CM | POA: Diagnosis not present

## 2019-10-15 DIAGNOSIS — N401 Enlarged prostate with lower urinary tract symptoms: Secondary | ICD-10-CM | POA: Diagnosis not present

## 2019-10-15 DIAGNOSIS — R82998 Other abnormal findings in urine: Secondary | ICD-10-CM | POA: Diagnosis not present

## 2019-10-15 DIAGNOSIS — Z0000 Encounter for general adult medical examination without abnormal findings: Secondary | ICD-10-CM | POA: Diagnosis not present

## 2019-10-15 DIAGNOSIS — E291 Testicular hypofunction: Secondary | ICD-10-CM | POA: Diagnosis not present

## 2019-12-27 ENCOUNTER — Telehealth: Payer: Self-pay | Admitting: *Deleted

## 2019-12-27 NOTE — Telephone Encounter (Signed)
Left a message [phone] change.
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
## 2019-12-28 ENCOUNTER — Ambulatory Visit: Payer: PPO | Admitting: Cardiology

## 2020-01-02 DIAGNOSIS — Z7189 Other specified counseling: Secondary | ICD-10-CM | POA: Insufficient documentation

## 2020-01-02 DIAGNOSIS — R931 Abnormal findings on diagnostic imaging of heart and coronary circulation: Secondary | ICD-10-CM | POA: Insufficient documentation

## 2020-01-02 DIAGNOSIS — E119 Type 2 diabetes mellitus without complications: Secondary | ICD-10-CM | POA: Insufficient documentation

## 2020-01-02 DIAGNOSIS — I10 Essential (primary) hypertension: Secondary | ICD-10-CM | POA: Insufficient documentation

## 2020-01-02 DIAGNOSIS — E1151 Type 2 diabetes mellitus with diabetic peripheral angiopathy without gangrene: Secondary | ICD-10-CM | POA: Insufficient documentation

## 2020-01-02 DIAGNOSIS — E785 Hyperlipidemia, unspecified: Secondary | ICD-10-CM | POA: Insufficient documentation
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
## 2020-01-04 ENCOUNTER — Encounter: Payer: Self-pay | Admitting: Cardiology

## 2020-01-04 ENCOUNTER — Other Ambulatory Visit: Payer: Self-pay

## 2020-01-04 ENCOUNTER — Ambulatory Visit: Payer: PPO | Admitting: Cardiology

## 2020-01-04 VITALS — BP 144/80 | HR 67 | Ht 70.0 in | Wt 249.8 lb

## 2020-01-04 DIAGNOSIS — E785 Hyperlipidemia, unspecified: Secondary | ICD-10-CM

## 2020-01-04 DIAGNOSIS — E119 Type 2 diabetes mellitus without complications: Secondary | ICD-10-CM

## 2020-01-04 DIAGNOSIS — I2510 Atherosclerotic heart disease of native coronary artery without angina pectoris: Secondary | ICD-10-CM | POA: Diagnosis not present

## 2020-01-04 DIAGNOSIS — R931 Abnormal findings on diagnostic imaging of heart and coronary circulation: Secondary | ICD-10-CM

## 2020-01-04 DIAGNOSIS — I10 Essential (primary) hypertension: Secondary | ICD-10-CM

## 2020-01-04 DIAGNOSIS — Z7189 Other specified counseling: Secondary | ICD-10-CM | POA: Diagnosis not present

## 2020-01-04 DIAGNOSIS — R079 Chest pain, unspecified: Secondary | ICD-10-CM | POA: Diagnosis not present

## 2020-01-04 MED ORDER — ROSUVASTATIN CALCIUM 20 MG PO TABS: 20.0000 mg | ORAL_TABLET | Freq: Every day | ORAL | 3 refills | Status: DC

## 2020-01-04 NOTE — Patient Instructions (Addendum)
Medication Instructions:  Increase Crestor (Rosuvastatin) to 20mg  daily *If you need a refill on your cardiac medications before your next appointment, please call your pharmacy*  Lab Work: None If you have labs (blood work) drawn today and your tests are completely normal, you will receive your results only by: Marland Kitchen MyChart Message (if you have MyChart) OR . A paper copy in the mail If you have any lab test that is abnormal or we need to change your treatment, we will call you to review the results.  Testing/Procedures: Your physician has requested that you have an exercise tolerance test in November. For further information please visit [email or web address]. Please also follow instruction sheet, as given. [facility] are scheduled for a Covid Screening 3 days before your exercise tolerance test.This is a Drive Up Visit at the [employer] [address] [place]. Someone will direct you to the appropriate testing line. Stay in your car and someone will be with you shortly   Follow-Up: At [facility], you and your health needs are our priority.  As part of our continuing mission to provide you with exceptional heart care, we have created designated Provider Care Teams.  These Care Teams include your primary Cardiologist (physician) and Advanced Practice Providers (APPs -  Physician Assistants and Nurse Practitioners) who all work together to provide you with the care you need, when you need it.  Your next appointment:   1 year(s)  The format for your next appointment:   In Person  Provider:   Minus Breeding, MD  Other Instructions Keep a blood pressure diary

## 2020-01-19 ENCOUNTER — Ambulatory Visit: Payer: PPO

## 2020-01-24 ENCOUNTER — Ambulatory Visit: Payer: PPO

## 2020-01-30 ENCOUNTER — Ambulatory Visit: Payer: PPO

## 2020-04-09 DIAGNOSIS — L84 Corns and callosities: Secondary | ICD-10-CM | POA: Diagnosis not present

## 2020-04-09 DIAGNOSIS — D225 Melanocytic nevi of trunk: Secondary | ICD-10-CM | POA: Diagnosis not present

## 2020-04-09 DIAGNOSIS — L820 Inflamed seborrheic keratosis: Secondary | ICD-10-CM | POA: Diagnosis not present

## 2020-04-09 DIAGNOSIS — L812 Freckles: Secondary | ICD-10-CM | POA: Diagnosis not present

## 2020-04-09 DIAGNOSIS — L218 Other seborrheic dermatitis: Secondary | ICD-10-CM | POA: Diagnosis not present

## 2020-04-09 DIAGNOSIS — L821 Other seborrheic keratosis: Secondary | ICD-10-CM | POA: Diagnosis not present

## 2020-05-12 ENCOUNTER — Encounter: Payer: Self-pay | Admitting: Internal Medicine

## 2020-05-12 ENCOUNTER — Other Ambulatory Visit: Payer: Self-pay

## 2020-05-12 ENCOUNTER — Ambulatory Visit (AMBULATORY_SURGERY_CENTER): Payer: Self-pay | Admitting: *Deleted

## 2020-05-12 VITALS — Ht 70.0 in | Wt 250.0 lb

## 2020-05-12 DIAGNOSIS — Z86010 Personal history of colonic polyps: Secondary | ICD-10-CM

## 2020-05-12 NOTE — Progress Notes (Signed)

## 2020-05-23 ENCOUNTER — Encounter: Payer: Self-pay | Admitting: Internal Medicine

## 2020-05-23 ENCOUNTER — Telehealth: Payer: Self-pay | Admitting: Internal Medicine

## 2020-05-23 ENCOUNTER — Other Ambulatory Visit: Payer: Self-pay

## 2020-05-23 ENCOUNTER — Ambulatory Visit (AMBULATORY_SURGERY_CENTER): Payer: PPO | Admitting: Internal Medicine

## 2020-05-23 VITALS — BP 108/75 | HR 75 | Temp 96.9°F | Resp 19 | Ht 70.0 in | Wt 250.0 lb

## 2020-05-23 DIAGNOSIS — D122 Benign neoplasm of ascending colon: Secondary | ICD-10-CM | POA: Diagnosis not present

## 2020-05-23 DIAGNOSIS — K5520 Angiodysplasia of colon without hemorrhage: Secondary | ICD-10-CM

## 2020-05-23 DIAGNOSIS — Z86010 Personal history of colonic polyps: Secondary | ICD-10-CM

## 2020-05-23 HISTORY — DX: Angiodysplasia of colon without hemorrhage: K55.20

## 2020-05-23 MED ORDER — SODIUM CHLORIDE 0.9 % IV SOLN: 500.0000 mL | INTRAVENOUS | Status: DC

## 2020-05-23 NOTE — Progress Notes (Signed)
V/s DT I have reviewed the patient's medical history in detail and updated the computerized patient record. 

## 2020-05-23 NOTE — Telephone Encounter (Signed)
Pts wife called back this afternoon asking what the "ASA III " a patient with severe systemic disease meant. I explained to her that this is a scale that anesthesia rates our patient's based upon their chronic medical conditions and this is nothing they should be concerned about. She verbalized understanding.

## 2020-05-23 NOTE — Progress Notes (Signed)
Called to room to assist during endoscopic procedure.  Patient ID and intended procedure confirmed with present staff. Received instructions for my participation in the procedure from the performing physician.  

## 2020-05-23 NOTE — Progress Notes (Signed)
pt tolerated well. VSS. awake and to recovery. Report given to RN.  

## 2020-05-23 NOTE — Op Note (Signed)
[name] Procedure Date: 05/23/2020 11:37 AM MRN: [id number] Endoscopist: Gatha Mayer , MD Age: 69 Referring MD:  Date of Birth: 1951-08-24 Gender: Male Account #: [id number] Procedure:                Colonoscopy Indications:              Surveillance: Personal history of adenomatous                            polyps on last colonoscopy 5 years ago Medicines:                Propofol per Anesthesia, Monitored Anesthesia Care Procedure:                Pre-Anesthesia Assessment:                           - Prior to the procedure, a History and Physical                            was performed, and patient medications and                            allergies were reviewed. The patient's tolerance of                            previous anesthesia was also reviewed. The risks                            and benefits of the procedure and the sedation                            options and risks were discussed with the patient.                            All questions were answered, and informed consent                            was obtained. Prior Anticoagulants: The patient has                            taken no previous anticoagulant or antiplatelet                            agents. ASA Grade Assessment: III - A patient with                            severe systemic disease. After reviewing the risks                            and benefits, the patient was deemed in                            satisfactory condition to undergo the procedure.  After obtaining informed consent, the colonoscope                            was passed under direct vision. Throughout the                            procedure, the patient's blood pressure, pulse, and                            oxygen saturations were monitored continuously. The                            Colonoscope was introduced through the anus and   advanced to the the cecum, identified by                            appendiceal orifice and ileocecal valve. The                            colonoscopy was performed without difficulty. The                            patient tolerated the procedure well. The quality                            of the bowel preparation was adequate. The                            ileocecal valve, appendiceal orifice, and rectum                            were photographed. The bowel preparation used was                            Miralax via split dose instruction. Scope In: 11:47:30 AM Scope Out: 12:07:05 PM Scope Withdrawal Time: 0 hours 15 minutes 45 seconds  Total Procedure Duration: 0 hours 19 minutes 35 seconds  Findings:                 The perianal and digital rectal examinations were                            normal. Pertinent negatives include normal prostate                            (size, shape, and consistency).                           A 2 mm polyp was found in the ascending colon. The                            polyp was sessile. The polyp was removed with a                            cold  biopsy forceps. Resection and retrieval were                            complete. Verification of patient identification                            for the specimen was done. Estimated blood loss was                            minimal.                           One small angiodysplastic lesion without bleeding                            was found in the ascending colon.                           The exam was otherwise without abnormality on                            direct and retroflexion views. Complications:            No immediate complications. Estimated Blood Loss:     Estimated blood loss was minimal. Impression:               - One 2 mm polyp in the ascending colon, removed                            with a cold biopsy forceps. Resected and retrieved.                           - One  non-bleeding colonic angiodysplastic lesion.                           - The examination was otherwise normal on direct                            and retroflexion views.                           - Personal history of colonic polyps.                           - 2005 5 adenomas largest 7 mm                           2010 - 3 mm cecal adenoma                           01/23/2015 2 diminutive transverse polyps removed 1                            adenoma Recommendation:           - Patient has a contact number available for  emergencies. The signs and symptoms of potential                            delayed complications were discussed with the                            patient. Return to normal activities tomorrow.                            Written discharge instructions were provided to the                            patient.                           - Resume previous diet.                           - Continue present medications.                           - Repeat colonoscopy is recommended for                            surveillance. The colonoscopy date will be                            determined after pathology results from today's                            exam become available for review. Gatha Mayer, MD 05/23/2020 12:16:30 PM This report has been signed electronically.

## 2020-05-23 NOTE — Patient Instructions (Addendum)
I found and removed one tiny polyp. About 2 mm maximum.  I will let you know pathology results and when to have another routine colonoscopy by mail and/or My Chart.  I appreciate the opportunity to care for you. Gatha Mayer, MD, [facility] Handouts given: Polyps Resume previous diet Continue present medications  YOU HAD AN ENDOSCOPIC PROCEDURE TODAY AT [facility]:   Refer to the procedure report that was given to you for any specific questions about what was found during the examination.  If the procedure report does not answer your questions, please call your gastroenterologist to clarify.  If you requested that your care partner not be given the details of your procedure findings, then the procedure report has been included in a sealed envelope for you to review at your convenience later.  YOU SHOULD EXPECT: Some feelings of bloating in the abdomen. Passage of more gas than usual.  Walking can help get rid of the air that was put into your GI tract during the procedure and reduce the bloating. If you had a lower endoscopy (such as a colonoscopy or flexible sigmoidoscopy) you may notice spotting of blood in your stool or on the toilet paper. If you underwent a bowel prep for your procedure, you may not have a normal bowel movement for a few days.  Please Note:  You might notice some irritation and congestion in your nose or some drainage.  This is from the oxygen used during your procedure.  There is no need for concern and it should clear up in a day or so.  SYMPTOMS TO REPORT IMMEDIATELY:   Following lower endoscopy (colonoscopy or flexible sigmoidoscopy):  Excessive amounts of blood in the stool  Significant tenderness or worsening of abdominal pains  Swelling of the abdomen that is new, acute  Fever of 100F or higher    For urgent or emergent issues, a gastroenterologist can be reached at any hour by calling [phone]. Do not use MyChart messaging for urgent  concerns.    DIET:  We do recommend a small meal at first, but then you may proceed to your regular diet.  Drink plenty of fluids but you should avoid alcoholic beverages for 24 hours.  ACTIVITY:  You should plan to take it easy for the rest of today and you should NOT DRIVE or use heavy machinery until tomorrow (because of the sedation medicines used during the test).    FOLLOW UP: Our staff will call the number listed on your records 48-72 hours following your procedure to check on you and address any questions or concerns that you may have regarding the information given to you following your procedure. If we do not reach you, we will leave a message.  We will attempt to reach you two times.  During this call, we will ask if you have developed any symptoms of COVID 19. If you develop any symptoms (ie: fever, flu-like symptoms, shortness of breath, cough etc.) before then, please call [phone].  If you test positive for Covid 19 in the 2 weeks post procedure, please call and report this information to [place].    If any biopsies were taken you will be contacted by phone or by letter within the next 1-3 weeks.  Please call us at [phone] if you have not heard about the biopsies in 3 weeks.    SIGNATURES/CONFIDENTIALITY: You and/or your care partner have signed paperwork which will be entered into your electronic medical record.  These signatures attest to the fact that that the information above on your After Visit Summary has been reviewed and is understood.  Full responsibility of the confidentiality of this discharge information lies with you and/or your care-partner.

## 2020-05-27 ENCOUNTER — Telehealth: Payer: Self-pay

## 2020-05-27 NOTE — Telephone Encounter (Signed)
No answer, left message to call back later today, B.Daveena Elmore RN. 

## 2020-05-27 NOTE — Telephone Encounter (Signed)
2nd follow up call made.  NALM 

## 2020-05-28 DIAGNOSIS — M79604 Pain in right leg: Secondary | ICD-10-CM | POA: Diagnosis not present

## 2020-06-01 ENCOUNTER — Encounter: Payer: Self-pay | Admitting: Internal Medicine

## 2020-06-24 DIAGNOSIS — R7401 Elevation of levels of liver transaminase levels: Secondary | ICD-10-CM | POA: Diagnosis not present

## 2020-06-24 DIAGNOSIS — E669 Obesity, unspecified: Secondary | ICD-10-CM | POA: Diagnosis not present

## 2020-06-24 DIAGNOSIS — Z8249 Family history of ischemic heart disease and other diseases of the circulatory system: Secondary | ICD-10-CM | POA: Diagnosis not present

## 2020-06-24 DIAGNOSIS — E1165 Type 2 diabetes mellitus with hyperglycemia: Secondary | ICD-10-CM | POA: Diagnosis not present

## 2020-06-24 DIAGNOSIS — G4733 Obstructive sleep apnea (adult) (pediatric): Secondary | ICD-10-CM | POA: Diagnosis not present

## 2020-06-24 DIAGNOSIS — I10 Essential (primary) hypertension: Secondary | ICD-10-CM | POA: Diagnosis not present

## 2020-06-24 DIAGNOSIS — E291 Testicular hypofunction: Secondary | ICD-10-CM | POA: Diagnosis not present

## 2020-06-24 DIAGNOSIS — M79606 Pain in leg, unspecified: Secondary | ICD-10-CM | POA: Diagnosis not present

## 2020-06-24 DIAGNOSIS — K635 Polyp of colon: Secondary | ICD-10-CM | POA: Diagnosis not present

## 2020-07-17 DIAGNOSIS — H2513 Age-related nuclear cataract, bilateral: Secondary | ICD-10-CM | POA: Diagnosis not present

## 2020-07-17 DIAGNOSIS — H25043 Posterior subcapsular polar age-related cataract, bilateral: Secondary | ICD-10-CM | POA: Diagnosis not present

## 2020-07-17 DIAGNOSIS — H2511 Age-related nuclear cataract, right eye: Secondary | ICD-10-CM | POA: Diagnosis not present

## 2020-07-17 DIAGNOSIS — H25013 Cortical age-related cataract, bilateral: Secondary | ICD-10-CM | POA: Diagnosis not present

## 2020-07-17 DIAGNOSIS — H18413 Arcus senilis, bilateral: Secondary | ICD-10-CM | POA: Diagnosis not present

## 2020-07-24 ENCOUNTER — Telehealth (HOSPITAL_COMMUNITY): Payer: Self-pay

## 2020-07-24 NOTE — Telephone Encounter (Signed)
Encounter complete. 

## 2020-07-29 ENCOUNTER — Other Ambulatory Visit: Payer: Self-pay

## 2020-07-29 ENCOUNTER — Ambulatory Visit (HOSPITAL_COMMUNITY)
Admission: RE | Admit: 2020-07-29 | Discharge: 2020-07-29 | Disposition: A | Discharge status: Home / Self Care | Payer: PPO | Source: Ambulatory Visit | Attending: Cardiovascular Disease | Admitting: Cardiovascular Disease

## 2020-07-29 DIAGNOSIS — I2510 Atherosclerotic heart disease of native coronary artery without angina pectoris: Secondary | ICD-10-CM

## 2020-07-29 LAB — EXERCISE TOLERANCE TEST
Estimated workload: 9.2 METS
Exercise duration (min): 7 min
Exercise duration (sec): 28 s
MPHR: 154 {beats}/min
Peak HR: 150 {beats}/min
Percent HR: 97 %
Rest HR: 64 {beats}/min

## 2020-08-04 ENCOUNTER — Other Ambulatory Visit: Payer: Self-pay

## 2020-08-04 ENCOUNTER — Ambulatory Visit (INDEPENDENT_AMBULATORY_CARE_PROVIDER_SITE_OTHER): Payer: PPO | Admitting: Neurology

## 2020-08-04 ENCOUNTER — Encounter: Payer: Self-pay | Admitting: Neurology

## 2020-08-04 VITALS — BP 152/85 | HR 61 | Ht 71.0 in | Wt 245.0 lb

## 2020-08-04 DIAGNOSIS — E785 Hyperlipidemia, unspecified: Secondary | ICD-10-CM

## 2020-08-04 DIAGNOSIS — I10 Essential (primary) hypertension: Secondary | ICD-10-CM

## 2020-08-04 DIAGNOSIS — Z9889 Other specified postprocedural states: Secondary | ICD-10-CM

## 2020-08-04 DIAGNOSIS — E119 Type 2 diabetes mellitus without complications: Secondary | ICD-10-CM

## 2020-08-04 NOTE — Patient Instructions (Signed)
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
## 2020-08-11 ENCOUNTER — Encounter: Payer: PPO | Attending: Internal Medicine | Admitting: Dietician

## 2020-08-11 ENCOUNTER — Encounter: Payer: Self-pay | Admitting: Dietician

## 2020-08-11 ENCOUNTER — Other Ambulatory Visit: Payer: Self-pay

## 2020-08-11 DIAGNOSIS — E119 Type 2 diabetes mellitus without complications: Secondary | ICD-10-CM | POA: Insufficient documentation

## 2020-08-11 NOTE — Progress Notes (Signed)
Diabetes Self-Management Education  Visit Type: First/Initial  Appt. Start Time: 1110 Appt. End Time: 1230  08/11/2020  [name], identified by name and date of birth, is a 69 y.o. male with a diagnosis of Diabetes: Type 2.   ASSESSMENT Patient is here today with his wife. He would like to make sure that he is eating the right things to help control his diabetes. He states that his father had bypass surgery at a young age and they aim to eat more Mediterranean.  They seldom eat red meat and eat mostly chicken, fish, or egg dish.  He eats few sweets.  States that he tries to be mindful about food choices and enjoys the food as he is eating.  History includes:  Type 2 Diabetes (dx 2018), HTN, HLD, fatty liver, OSA (considering a sleep study) Medications include Invokana, Metformin.  He states that Lawrence Johnson is becoming more expensive. A1C 6.9% 06/2020 increased form 6.3$ 09/2020  Weight hx: 242 lbs today.  Wife reports that he looks thinner since more consistently walking. 255 lbs 02/15/2018 260 lbs 2016, lost and regained  Patient lives with his wife.  She does most of the shopping and planning and they share cooking.  She has Crohn's disease and eat differently.  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  He has been walking at 6:30 am for the past 1 1/2 months for 1 1/2 miles per day.  He has a total gym and stopped this when he started walking. He was active in his youth with marshal arts, basketball, swimming and other activities.  Height 5\' 10"  (1.778 m), weight 242 lb (109.8 kg). Body mass index is 34.72 kg/m.   Diabetes Self-Management Education - 08/11/20 1140      Visit Information   Visit Type First/Initial      Initial Visit   Diabetes Type Type 2    Are you currently following a meal plan? Yes    What type of meal plan do you follow? Mediterranean    Are you taking your medications as prescribed? Yes    Date Diagnosed 2018       Health Coping   How would you rate your overall health? Good      Psychosocial Assessment   Patient Belief/Attitude about Diabetes Motivated to manage diabetes    Self-care barriers None    Self-management support Doctor's office;Family    Other persons present Patient;Spouse/SO    Patient Concerns Nutrition/Meal planning    Special Needs None    Preferred Learning Style No preference indicated    Learning Readiness Ready    How often do you need to have someone help you when you read instructions, pamphlets, or other written materials from your doctor or pharmacy? 1 - Never    What is the last grade level you completed in school? 15      Pre-Education Assessment   Patient understands the diabetes disease and treatment process. Needs Instruction    Patient understands incorporating nutritional management into lifestyle. Needs Instruction    Patient undertands incorporating physical activity into lifestyle. Needs Instruction    Patient understands using medications safely. Needs Instruction    Patient understands monitoring blood glucose, interpreting and using results Needs Instruction    Patient understands prevention, detection, and treatment of acute complications. Needs Instruction    Patient understands prevention, detection, and treatment of chronic complications. Needs Instruction    Patient understands how to develop  strategies to address psychosocial issues. Needs Instruction    Patient understands how to develop strategies to promote health/change behavior. Needs Instruction      Complications   Last HgB A1C per patient/outside source 6.9 %   06/24/2020 increased from 6.3% 09/2019   How often do you check your blood sugar? 3-4 times / week    Fasting Blood glucose range (mg/dL) 130-179    Postprandial Blood glucose range (mg/dL) 130-179    Number of hypoglycemic episodes per month 0    Number of hyperglycemic episodes per week 0    Have you had a dilated eye exam in the past  12 months? Yes    Have you had a dental exam in the past 12 months? Yes    Are you checking your feet? Yes    How many days per week are you checking your feet? 2      Dietary Intake   Breakfast protein drink, banana OR smoked salmon, 100 calorie English muffin, cheese, occasional melon or berries, rare avocado    Snack (morning) none    Lunch sandwich ([place], tomato, cheese, mayo) on lite bread, occasional banana OR [employer] fried sandwich or salad OR BBQ sandwich and hotdog all the way    Snack (afternoon) none    Dinner grilled chicken or salmon (8oz), steamed vegetables, small portion potatoes    Snack (evening) nuts or apple    Beverage(s) water, zero water, diet soda, beverages with stevia      Exercise   Exercise Type Light (walking / raking leaves)    How many days per week to you exercise? 6    How many minutes per day do you exercise? 35    Total minutes per week of exercise 210      Patient Education   Previous Diabetes Education Yes (please comment)   prediabetes edcuation 2017          Individualized Plan for Diabetes Self-Management Training:   Learning Objective:  Patient will have a greater understanding of diabetes self-management. Patient education plan is to attend individual and/or group sessions per assessed needs and concerns.   Plan:   Patient Instructions  Plan:  Check your feet daily. Consider adding the total gym routine back. Continue to be consistent.  Aim for 3 Carb Choices per meal (45 grams) +/- 1 either way  Aim for 0-1 Carbs per snack if hungry  Include protein in moderation with your meals and snacks Consider reading food labels for Total Carbohydrate of foods Consider  increasing your activity level by weights for 20 minutes 2-3 times per week as tolerated Consider checking BG at alternate times per day  Continue taking medication as directed by MD      Expected Outcomes:     Education material provided: ADA - How to Thrive:  A Guide for Your Journey with Diabetes, Meal plan card, Snack sheet and Diabetes Resources  If problems or questions, patient to contact team via:  Phone  Future DSME appointment:

## 2020-08-11 NOTE — Patient Instructions (Signed)
Plan:  Check your feet daily. Consider adding the total gym routine back. Continue to be consistent.  Aim for 3 Carb Choices per meal (45 grams) +/- 1 either way  Aim for 0-1 Carbs per snack if hungry  Include protein in moderation with your meals and snacks Consider reading food labels for Total Carbohydrate of foods Consider  increasing your activity level by weights for 20 minutes 2-3 times per week as tolerated Consider checking BG at alternate times per day  Continue taking medication as directed by MD

## 2020-08-13 ENCOUNTER — Telehealth: Payer: Self-pay

## 2020-08-13 NOTE — Telephone Encounter (Signed)
U/A to LVM for pt to schedule sleep study. Will call back this afternoon or reach out to emergency contact.

## 2020-09-15 DIAGNOSIS — H2512 Age-related nuclear cataract, left eye: Secondary | ICD-10-CM | POA: Diagnosis not present

## 2020-09-16 DIAGNOSIS — H2511 Age-related nuclear cataract, right eye: Secondary | ICD-10-CM | POA: Diagnosis not present

## 2020-09-22 ENCOUNTER — Ambulatory Visit (INDEPENDENT_AMBULATORY_CARE_PROVIDER_SITE_OTHER): Payer: PPO | Admitting: Neurology

## 2020-09-22 DIAGNOSIS — Z9889 Other specified postprocedural states: Secondary | ICD-10-CM

## 2020-09-22 DIAGNOSIS — I10 Essential (primary) hypertension: Secondary | ICD-10-CM

## 2020-09-22 DIAGNOSIS — G4733 Obstructive sleep apnea (adult) (pediatric): Secondary | ICD-10-CM | POA: Diagnosis not present

## 2020-09-22 DIAGNOSIS — R931 Abnormal findings on diagnostic imaging of heart and coronary circulation: Secondary | ICD-10-CM

## 2020-09-22 DIAGNOSIS — E785 Hyperlipidemia, unspecified: Secondary | ICD-10-CM

## 2020-09-22 DIAGNOSIS — E119 Type 2 diabetes mellitus without complications: Secondary | ICD-10-CM

## 2020-09-26 DIAGNOSIS — Z9889 Other specified postprocedural states: Secondary | ICD-10-CM | POA: Insufficient documentation

## 2020-09-26 DIAGNOSIS — G4733 Obstructive sleep apnea (adult) (pediatric): Secondary | ICD-10-CM | POA: Insufficient documentation

## 2020-09-26 NOTE — Progress Notes (Signed)
This HST (Home Sleep Test) confirmed the presence of mild-  moderate OSA (obstructive Sleep Apnea) The overall AHI being  17.2/h and REM AHI at 37.2/h.  The RDI indicated the presence of loud snoring. The total time in  oxygen desaturation was 24.8 min.    Recommendations:    This is REM dependent apnea and would not respond to dental  device or inspire implant therapy- These open the upper most  airway, as the UPPP should have already done, and don't address  chest wall or diaphragmatic weakness in the body's gas exchange.  PAP therapy is the only option for this REM dependent apnea.  Please provide an autotitration capable device with a setting  from 6-16 cm water and 2 cm EPR, heated humidification and the  patient's choice of mask.    Interpreting Physician: Larey Seat, MD

## 2020-09-26 NOTE — Addendum Note (Signed)
Addended by: Larey Seat on: 09/26/2020 12:17 PM   Modules accepted: Orders

## 2020-09-26 NOTE — Procedures (Signed)
Sleep Study Report   Patient Information     First Name: Lawrence Johnson. Last Name: [place] ID: [id number]  Birth Date: 2051-01-22 Age: 69 Gender: Male  Referring Provider: [name], MD BMI: 34.3 (W=244 lbs, H=5' 11'')  Neck Circ.:  18 '' Epworth:  5/24   Sleep Study Information    Study Date: 09/22/20 S/H/A Version: [id number] / [id number] / 79  History:    Lawrence Johnson is a 69 y.o. year old White or Caucasian male patient upon request from Dr. Virgina Jock in a new patient consultation on 08/04/2020. This patient presents today to address sleep apnea. Chief concern according to patient:   He had one PSG over 20 years ago and afterwards had a tonsillectomy and uvula trimmed/ UPPP. Recently, he describes not feeling well rested and always tired. He averages only about 4-5 hours of uninterrupted sleep. Lawrence Johnson is a right-handed Caucasian male with medical history of Angiodysplasia of colon (05/23/2020), Arthritis, Asthma, Blood transfusion without reported diagnosis, Diabetes mellitus without complication ([place]), Elevated LFTs, Fatty liver, GERD (gastroesophageal reflux disease), Hx of colonic polyp - adenoma (07/29/2009), Hyperglycemia, Hyperlipidemia, Hypertension, and Obesity. Especially important is that he was diagnosed with diabetes mellitus type 2 in January 2017 at the time his HbA1c was 8.2 and the capillary blood glucose level was 411.  Hypertension and hyperlipidemia have been known for a while. His insomnia has been coming on slowly and is chronic, low testosterone levels have been repeatedly checked, a diagnosis of past obstructive sleep apnea status post UPPP is listed, but not the date of the UPPP. Summary & Diagnosis:      This HST (Home Sleep Test) confirmed the presence of mild- moderate OSA (obstructive Sleep Apnea) The overall AHI being 17.2/h and REM AHI at 37.2/h. The RDI indicated the presence of loud snoring. The total time in oxygen desaturation was 24.8 min.   Recommendations:       This is REM dependent apnea and would not respond to dental device or inspire implant therapy- These open the upper most airway, as the UPPP should have already done, and don't address chest wall or diaphragmatic weakness in the body's gas exchange. PAP therapy is the only option for this REM dependent apnea. Please provide an autotitration capable device with a setting from 6-16 cm water and 2 cm EPR, heated humidification and the patient's choice of mask.    Interpreting Physician: Larey Seat, MD            Sleep Summary  Oxygen Saturation Statistics   Start Study Time: End Study Time: Total Recording Time:          10:47:12 PM 5:39:45 AM 6 h, 52 min  Total Sleep Time % REM of Sleep Time:  6 h, 12 min  36.0    Mean: 92 Minimum: 75 Maximum: 97  Mean of Desaturations Nadirs (%):   87  Oxygen Desat. %:  4-9 10-20 >20 Total  Events Number Total   52  9 85.2 14.8  0 0.0  61 100.0  Oxygen Saturation: <90 <=88 <85 <80 <70  Duration (minutes): Sleep % 24.8 6.6 14.2 2.9 3.8 0.8 0.5 0.1 0.0 0.0     Respiratory Indices      Total Events REM NREM All Night  pRDI: pAHI 3%: ODI 4%: pAHIc 3%: % CSR: pAHI 4%:  137  106  61  0 0.0 66 44.0 37.2 22.2 0.0 10.1 6.0 3.0 0.0 22.2 17.2 9.9 0.0 10.7  Pulse Rate Statistics during Sleep (BPM)      Mean: 71 Minimum: 52 Maximum: 98    Indices are calculated using technically valid sleep time of 6 h, 10 min.                          pAHI=17.2                                       Mild              Moderate                    Severe                                                 5              15                    30

## 2020-09-29 ENCOUNTER — Telehealth: Payer: Self-pay | Admitting: Neurology

## 2020-09-29 NOTE — Telephone Encounter (Signed)
I called pt. I advised pt that Dr. Brett Fairy reviewed their sleep study results and found that pt has sleep apnea. Dr. Brett Fairy recommends that pt starts auto CPAP 6-16 cm water pressure. I reviewed PAP compliance expectations with the pt. Pt is agreeable to starting a CPAP. I advised pt that an order will be sent to a DME, [facility], and [facility] will call the pt within about one week after they file with the pt's insurance. [facility] will show the pt how to use the machine, fit for masks, and troubleshoot the CPAP if needed. A follow up appt was made for insurance purposes with Dr. Brett Fairy on Jan 11,2022, at 8:30 am. Pt verbalized understanding to arrive 15 minutes early and bring their CPAP. A letter with all of this information in it will be mailed to the pt as a reminder. I verified with the pt that the address we have on file is correct. Pt verbalized understanding of results. Pt had no questions at this time but was encouraged to call back if questions arise. I have sent the order to [facility] and have received confirmation that they have received the order.

## 2020-09-29 NOTE — Telephone Encounter (Signed)
-----   Message from Larey Seat, MD sent at 09/26/2020 12:17 PM EDT ----- This HST (Home Sleep Test) confirmed the presence of mild-  moderate OSA (obstructive Sleep Apnea) The overall AHI being  17.2/h and REM AHI at 37.2/h.  The RDI indicated the presence of loud snoring. The total time in  oxygen desaturation was 24.8 min.    Recommendations:    This is REM dependent apnea and would not respond to dental  device or inspire implant therapy- These open the upper most  airway, as the UPPP should have already done, and don't address  chest wall or diaphragmatic weakness in the body's gas exchange.  PAP therapy is the only option for this REM dependent apnea.  Please provide an autotitration capable device with a setting  from 6-16 cm water and 2 cm EPR, heated humidification and the  patient's choice of mask.    Interpreting Physician: Larey Seat, MD

## 2020-10-06 DIAGNOSIS — H2511 Age-related nuclear cataract, right eye: Secondary | ICD-10-CM | POA: Diagnosis not present

## 2020-10-09 DIAGNOSIS — D696 Thrombocytopenia, unspecified: Secondary | ICD-10-CM | POA: Diagnosis not present

## 2020-10-09 DIAGNOSIS — Z125 Encounter for screening for malignant neoplasm of prostate: Secondary | ICD-10-CM | POA: Diagnosis not present

## 2020-10-09 DIAGNOSIS — Z0000 Encounter for general adult medical examination without abnormal findings: Secondary | ICD-10-CM | POA: Diagnosis not present

## 2020-10-09 DIAGNOSIS — E291 Testicular hypofunction: Secondary | ICD-10-CM | POA: Diagnosis not present

## 2020-10-09 DIAGNOSIS — E785 Hyperlipidemia, unspecified: Secondary | ICD-10-CM | POA: Diagnosis not present

## 2020-10-09 DIAGNOSIS — I10 Essential (primary) hypertension: Secondary | ICD-10-CM | POA: Diagnosis not present

## 2020-10-09 DIAGNOSIS — E1165 Type 2 diabetes mellitus with hyperglycemia: Secondary | ICD-10-CM | POA: Diagnosis not present

## 2020-10-16 DIAGNOSIS — E291 Testicular hypofunction: Secondary | ICD-10-CM | POA: Diagnosis not present

## 2020-10-16 DIAGNOSIS — Z0000 Encounter for general adult medical examination without abnormal findings: Secondary | ICD-10-CM | POA: Diagnosis not present

## 2020-10-16 DIAGNOSIS — Z7689 Persons encountering health services in other specified circumstances: Secondary | ICD-10-CM | POA: Diagnosis not present

## 2020-10-16 DIAGNOSIS — I10 Essential (primary) hypertension: Secondary | ICD-10-CM | POA: Diagnosis not present

## 2020-10-16 DIAGNOSIS — N401 Enlarged prostate with lower urinary tract symptoms: Secondary | ICD-10-CM | POA: Diagnosis not present

## 2020-10-16 DIAGNOSIS — E669 Obesity, unspecified: Secondary | ICD-10-CM | POA: Diagnosis not present

## 2020-10-16 DIAGNOSIS — Z8249 Family history of ischemic heart disease and other diseases of the circulatory system: Secondary | ICD-10-CM | POA: Diagnosis not present

## 2020-10-16 DIAGNOSIS — G4733 Obstructive sleep apnea (adult) (pediatric): Secondary | ICD-10-CM | POA: Diagnosis not present

## 2020-10-16 DIAGNOSIS — E785 Hyperlipidemia, unspecified: Secondary | ICD-10-CM | POA: Diagnosis not present

## 2020-10-16 DIAGNOSIS — R7401 Elevation of levels of liver transaminase levels: Secondary | ICD-10-CM | POA: Diagnosis not present

## 2020-10-16 DIAGNOSIS — E1165 Type 2 diabetes mellitus with hyperglycemia: Secondary | ICD-10-CM | POA: Diagnosis not present

## 2020-10-16 DIAGNOSIS — D696 Thrombocytopenia, unspecified: Secondary | ICD-10-CM | POA: Diagnosis not present

## 2020-10-24 NOTE — Telephone Encounter (Signed)
Pt. Called to get an update on sleep study machine. He states he never heard anything from anyone. Please advise.  Best contact: [phone]

## 2020-10-27 ENCOUNTER — Encounter: Payer: Self-pay | Admitting: Neurology

## 2020-10-27 NOTE — Telephone Encounter (Signed)
Pt made aware that order was sent to [facility] for [facility]. Provided their contact information.

## 2020-11-21 DIAGNOSIS — H6593 Unspecified nonsuppurative otitis media, bilateral: Secondary | ICD-10-CM | POA: Diagnosis not present

## 2020-11-21 DIAGNOSIS — H9202 Otalgia, left ear: Secondary | ICD-10-CM | POA: Diagnosis not present

## 2020-11-21 DIAGNOSIS — J309 Allergic rhinitis, unspecified: Secondary | ICD-10-CM | POA: Diagnosis not present

## 2020-11-24 DIAGNOSIS — G4733 Obstructive sleep apnea (adult) (pediatric): Secondary | ICD-10-CM | POA: Diagnosis not present

## 2020-12-07 ENCOUNTER — Other Ambulatory Visit: Payer: Self-pay | Admitting: Cardiology

## 2020-12-25 DIAGNOSIS — G4733 Obstructive sleep apnea (adult) (pediatric): Secondary | ICD-10-CM | POA: Diagnosis not present

## 2020-12-30 ENCOUNTER — Encounter: Payer: Self-pay | Admitting: Neurology

## 2020-12-30 ENCOUNTER — Ambulatory Visit: Payer: PPO | Admitting: Neurology

## 2020-12-30 ENCOUNTER — Other Ambulatory Visit: Payer: Self-pay

## 2020-12-30 VITALS — BP 147/74 | HR 67 | Ht 70.0 in | Wt 246.0 lb

## 2020-12-30 DIAGNOSIS — Z9989 Dependence on other enabling machines and devices: Secondary | ICD-10-CM

## 2020-12-30 DIAGNOSIS — I10 Essential (primary) hypertension: Secondary | ICD-10-CM | POA: Diagnosis not present

## 2020-12-30 DIAGNOSIS — G4733 Obstructive sleep apnea (adult) (pediatric): Secondary | ICD-10-CM

## 2020-12-30 DIAGNOSIS — Z9889 Other specified postprocedural states: Secondary | ICD-10-CM | POA: Diagnosis not present

## 2020-12-30 DIAGNOSIS — E119 Type 2 diabetes mellitus without complications: Secondary | ICD-10-CM

## 2020-12-30 NOTE — Patient Instructions (Signed)
Compensation Strategies for Tremors  When eating, try the following Eat out of bowls, divided plates, or use a plate guard (available at a medical supply store) and eat with a spoon so that you have an edge to scoop up food. Try raising your plate so that there is less distance between the plate and mouth.Try stabilizing elbows on the tables or against your body. Use utensil with built-up/larger grips as they are easier to hold.  When writing, try the following: Stabilize forearm on the table. Take your time as rushing/being stressed can increase tremors. Try a felt-tipped pen, it does not glide as much.  Avoid gel pens ( they move to much ). Consider using pre-printed labels with your name and address (carry them with you when you go out) or you can get stamps with your address or signature on it. Use a small tape recorder to record messages/reminders for yourself. Use pens with bigger grips.  When brushing your teeth, putting on make-up, or styling hair, try the following: Use an electric toothbrush. Use items with built-up grips. Stabilize your elbows against your body or on the counter. Use long-handled brushes/combs. Use a hair dryer with a stand.  In general: Avoid stress, fatigue or rushing as this can increase tremors. Sit down for activities that require more control/coordination. Perform "flicks".  
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
## 2020-12-31 NOTE — Progress Notes (Signed)
Order for CPAP pressure change and mask refitting faxed to [facility]  Received receipt of confirmation.
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
## 2021-01-09 ENCOUNTER — Ambulatory Visit: Payer: PPO | Admitting: Cardiology

## 2021-01-09 ENCOUNTER — Other Ambulatory Visit: Payer: Self-pay

## 2021-01-09 ENCOUNTER — Encounter: Payer: Self-pay | Admitting: Cardiology

## 2021-01-09 VITALS — BP 150/72 | HR 62 | Ht 70.0 in | Wt 248.0 lb

## 2021-01-09 DIAGNOSIS — E119 Type 2 diabetes mellitus without complications: Secondary | ICD-10-CM | POA: Diagnosis not present

## 2021-01-09 DIAGNOSIS — R931 Abnormal findings on diagnostic imaging of heart and coronary circulation: Secondary | ICD-10-CM

## 2021-01-09 DIAGNOSIS — G4730 Sleep apnea, unspecified: Secondary | ICD-10-CM

## 2021-01-09 DIAGNOSIS — I10 Essential (primary) hypertension: Secondary | ICD-10-CM

## 2021-01-09 NOTE — Patient Instructions (Signed)
Medication Instructions:  No changes  Lab Work: None ordered this visit  Testing/Procedures: None ordered this visit  Follow-Up: At [facility], you and your health needs are our priority.  As part of our continuing mission to provide you with exceptional heart care, we have created designated Provider Care Teams.  These Care Teams include your primary Cardiologist (physician) and Advanced Practice Providers (APPs -  Physician Assistants and Nurse Practitioners) who all work together to provide you with the care you need, when you need it.  Your next appointment:   12 month(s)  You will receive a reminder letter in the mail two months in advance. If you don't receive a letter, please call our office to schedule the follow-up appointment.  The format for your next appointment:   In Person  Provider:   Minus Breeding, MD

## 2021-01-12 ENCOUNTER — Other Ambulatory Visit (INDEPENDENT_AMBULATORY_CARE_PROVIDER_SITE_OTHER): Payer: PPO

## 2021-01-12 DIAGNOSIS — I10 Essential (primary) hypertension: Secondary | ICD-10-CM | POA: Diagnosis not present

## 2021-01-15 NOTE — Addendum Note (Signed)
Addended by: Wonda Horner on: 01/15/2021 04:20 PM   Modules accepted: Orders

## 2021-01-25 DIAGNOSIS — G4733 Obstructive sleep apnea (adult) (pediatric): Secondary | ICD-10-CM | POA: Diagnosis not present

## 2021-02-16 DIAGNOSIS — G4733 Obstructive sleep apnea (adult) (pediatric): Secondary | ICD-10-CM | POA: Diagnosis not present

## 2021-02-16 DIAGNOSIS — H903 Sensorineural hearing loss, bilateral: Secondary | ICD-10-CM | POA: Diagnosis not present

## 2021-02-16 DIAGNOSIS — J343 Hypertrophy of nasal turbinates: Secondary | ICD-10-CM | POA: Diagnosis not present

## 2021-02-16 DIAGNOSIS — J342 Deviated nasal septum: Secondary | ICD-10-CM | POA: Diagnosis not present

## 2021-02-16 DIAGNOSIS — K219 Gastro-esophageal reflux disease without esophagitis: Secondary | ICD-10-CM | POA: Diagnosis not present

## 2021-02-16 DIAGNOSIS — H938X2 Other specified disorders of left ear: Secondary | ICD-10-CM | POA: Diagnosis not present

## 2021-02-16 DIAGNOSIS — Z9989 Dependence on other enabling machines and devices: Secondary | ICD-10-CM | POA: Diagnosis not present

## 2021-02-22 DIAGNOSIS — G4733 Obstructive sleep apnea (adult) (pediatric): Secondary | ICD-10-CM | POA: Diagnosis not present

## 2021-03-13 DIAGNOSIS — H903 Sensorineural hearing loss, bilateral: Secondary | ICD-10-CM | POA: Diagnosis not present

## 2021-03-25 DIAGNOSIS — G4733 Obstructive sleep apnea (adult) (pediatric): Secondary | ICD-10-CM | POA: Diagnosis not present

## 2021-04-13 DIAGNOSIS — L814 Other melanin hyperpigmentation: Secondary | ICD-10-CM | POA: Diagnosis not present

## 2021-04-13 DIAGNOSIS — L820 Inflamed seborrheic keratosis: Secondary | ICD-10-CM | POA: Diagnosis not present

## 2021-04-13 DIAGNOSIS — L821 Other seborrheic keratosis: Secondary | ICD-10-CM | POA: Diagnosis not present

## 2021-04-13 DIAGNOSIS — D2371 Other benign neoplasm of skin of right lower limb, including hip: Secondary | ICD-10-CM | POA: Diagnosis not present

## 2021-04-13 DIAGNOSIS — D2372 Other benign neoplasm of skin of left lower limb, including hip: Secondary | ICD-10-CM | POA: Diagnosis not present

## 2021-04-13 DIAGNOSIS — D225 Melanocytic nevi of trunk: Secondary | ICD-10-CM | POA: Diagnosis not present

## 2021-04-13 DIAGNOSIS — L218 Other seborrheic dermatitis: Secondary | ICD-10-CM | POA: Diagnosis not present

## 2021-04-13 DIAGNOSIS — L812 Freckles: Secondary | ICD-10-CM | POA: Diagnosis not present

## 2021-04-20 DIAGNOSIS — Z961 Presence of intraocular lens: Secondary | ICD-10-CM | POA: Diagnosis not present

## 2021-04-20 DIAGNOSIS — E119 Type 2 diabetes mellitus without complications: Secondary | ICD-10-CM | POA: Diagnosis not present

## 2021-04-23 DIAGNOSIS — M79606 Pain in leg, unspecified: Secondary | ICD-10-CM | POA: Diagnosis not present

## 2021-04-23 DIAGNOSIS — K760 Fatty (change of) liver, not elsewhere classified: Secondary | ICD-10-CM | POA: Diagnosis not present

## 2021-04-23 DIAGNOSIS — D696 Thrombocytopenia, unspecified: Secondary | ICD-10-CM | POA: Diagnosis not present

## 2021-04-23 DIAGNOSIS — I10 Essential (primary) hypertension: Secondary | ICD-10-CM | POA: Diagnosis not present

## 2021-04-23 DIAGNOSIS — E785 Hyperlipidemia, unspecified: Secondary | ICD-10-CM | POA: Diagnosis not present

## 2021-04-23 DIAGNOSIS — I2510 Atherosclerotic heart disease of native coronary artery without angina pectoris: Secondary | ICD-10-CM | POA: Diagnosis not present

## 2021-04-23 DIAGNOSIS — I2584 Coronary atherosclerosis due to calcified coronary lesion: Secondary | ICD-10-CM | POA: Diagnosis not present

## 2021-04-23 DIAGNOSIS — G4700 Insomnia, unspecified: Secondary | ICD-10-CM | POA: Diagnosis not present

## 2021-04-23 DIAGNOSIS — E1165 Type 2 diabetes mellitus with hyperglycemia: Secondary | ICD-10-CM | POA: Diagnosis not present

## 2021-04-23 DIAGNOSIS — G4733 Obstructive sleep apnea (adult) (pediatric): Secondary | ICD-10-CM | POA: Diagnosis not present

## 2021-04-23 DIAGNOSIS — E669 Obesity, unspecified: Secondary | ICD-10-CM | POA: Diagnosis not present

## 2021-04-23 DIAGNOSIS — Z8249 Family history of ischemic heart disease and other diseases of the circulatory system: Secondary | ICD-10-CM | POA: Diagnosis not present

## 2021-04-24 DIAGNOSIS — G4733 Obstructive sleep apnea (adult) (pediatric): Secondary | ICD-10-CM | POA: Diagnosis not present

## 2021-04-27 DIAGNOSIS — H938X2 Other specified disorders of left ear: Secondary | ICD-10-CM | POA: Diagnosis not present

## 2021-04-27 DIAGNOSIS — K219 Gastro-esophageal reflux disease without esophagitis: Secondary | ICD-10-CM | POA: Diagnosis not present

## 2021-04-27 DIAGNOSIS — H903 Sensorineural hearing loss, bilateral: Secondary | ICD-10-CM | POA: Diagnosis not present

## 2021-05-25 DIAGNOSIS — G4733 Obstructive sleep apnea (adult) (pediatric): Secondary | ICD-10-CM | POA: Diagnosis not present

## 2021-05-28 DIAGNOSIS — H43812 Vitreous degeneration, left eye: Secondary | ICD-10-CM | POA: Diagnosis not present

## 2021-05-28 DIAGNOSIS — H538 Other visual disturbances: Secondary | ICD-10-CM | POA: Diagnosis not present

## 2021-06-15 DIAGNOSIS — G4733 Obstructive sleep apnea (adult) (pediatric): Secondary | ICD-10-CM | POA: Diagnosis not present

## 2021-06-24 DIAGNOSIS — G4733 Obstructive sleep apnea (adult) (pediatric): Secondary | ICD-10-CM | POA: Diagnosis not present

## 2021-06-30 DIAGNOSIS — H26492 Other secondary cataract, left eye: Secondary | ICD-10-CM | POA: Diagnosis not present

## 2021-06-30 DIAGNOSIS — H43812 Vitreous degeneration, left eye: Secondary | ICD-10-CM | POA: Diagnosis not present

## 2021-07-25 DIAGNOSIS — G4733 Obstructive sleep apnea (adult) (pediatric): Secondary | ICD-10-CM | POA: Diagnosis not present

## 2021-08-11 DIAGNOSIS — H02831 Dermatochalasis of right upper eyelid: Secondary | ICD-10-CM | POA: Diagnosis not present

## 2021-08-11 DIAGNOSIS — H26492 Other secondary cataract, left eye: Secondary | ICD-10-CM | POA: Diagnosis not present

## 2021-08-11 DIAGNOSIS — H18413 Arcus senilis, bilateral: Secondary | ICD-10-CM | POA: Diagnosis not present

## 2021-08-11 DIAGNOSIS — Z961 Presence of intraocular lens: Secondary | ICD-10-CM | POA: Diagnosis not present

## 2021-08-25 DIAGNOSIS — G4733 Obstructive sleep apnea (adult) (pediatric): Secondary | ICD-10-CM | POA: Diagnosis not present

## 2021-09-24 DIAGNOSIS — G4733 Obstructive sleep apnea (adult) (pediatric): Secondary | ICD-10-CM | POA: Diagnosis not present

## 2021-10-25 DIAGNOSIS — G4733 Obstructive sleep apnea (adult) (pediatric): Secondary | ICD-10-CM | POA: Diagnosis not present

## 2021-10-30 DIAGNOSIS — E291 Testicular hypofunction: Secondary | ICD-10-CM | POA: Diagnosis not present

## 2021-10-30 DIAGNOSIS — M791 Myalgia, unspecified site: Secondary | ICD-10-CM | POA: Diagnosis not present

## 2021-10-30 DIAGNOSIS — Z125 Encounter for screening for malignant neoplasm of prostate: Secondary | ICD-10-CM | POA: Diagnosis not present

## 2021-10-30 DIAGNOSIS — I10 Essential (primary) hypertension: Secondary | ICD-10-CM | POA: Diagnosis not present

## 2021-10-30 DIAGNOSIS — E1165 Type 2 diabetes mellitus with hyperglycemia: Secondary | ICD-10-CM | POA: Diagnosis not present

## 2021-10-30 DIAGNOSIS — E785 Hyperlipidemia, unspecified: Secondary | ICD-10-CM | POA: Diagnosis not present

## 2021-11-04 DIAGNOSIS — Z0000 Encounter for general adult medical examination without abnormal findings: Secondary | ICD-10-CM | POA: Diagnosis not present

## 2021-11-04 DIAGNOSIS — Z1389 Encounter for screening for other disorder: Secondary | ICD-10-CM | POA: Diagnosis not present

## 2021-11-04 DIAGNOSIS — R399 Unspecified symptoms and signs involving the genitourinary system: Secondary | ICD-10-CM | POA: Diagnosis not present

## 2021-11-04 DIAGNOSIS — Z1331 Encounter for screening for depression: Secondary | ICD-10-CM | POA: Diagnosis not present

## 2021-11-04 DIAGNOSIS — I10 Essential (primary) hypertension: Secondary | ICD-10-CM | POA: Diagnosis not present

## 2021-11-04 DIAGNOSIS — R82998 Other abnormal findings in urine: Secondary | ICD-10-CM | POA: Diagnosis not present

## 2021-11-04 DIAGNOSIS — I2510 Atherosclerotic heart disease of native coronary artery without angina pectoris: Secondary | ICD-10-CM | POA: Diagnosis not present

## 2021-11-04 DIAGNOSIS — E1165 Type 2 diabetes mellitus with hyperglycemia: Secondary | ICD-10-CM | POA: Diagnosis not present

## 2021-11-04 DIAGNOSIS — E785 Hyperlipidemia, unspecified: Secondary | ICD-10-CM | POA: Diagnosis not present

## 2021-11-04 DIAGNOSIS — E669 Obesity, unspecified: Secondary | ICD-10-CM | POA: Diagnosis not present

## 2021-11-04 DIAGNOSIS — K760 Fatty (change of) liver, not elsewhere classified: Secondary | ICD-10-CM | POA: Diagnosis not present

## 2021-11-04 DIAGNOSIS — I2584 Coronary atherosclerosis due to calcified coronary lesion: Secondary | ICD-10-CM | POA: Diagnosis not present

## 2021-11-04 DIAGNOSIS — N401 Enlarged prostate with lower urinary tract symptoms: Secondary | ICD-10-CM | POA: Diagnosis not present

## 2021-11-04 DIAGNOSIS — Z8249 Family history of ischemic heart disease and other diseases of the circulatory system: Secondary | ICD-10-CM | POA: Diagnosis not present

## 2021-11-04 DIAGNOSIS — M79606 Pain in leg, unspecified: Secondary | ICD-10-CM | POA: Diagnosis not present

## 2021-11-17 ENCOUNTER — Other Ambulatory Visit (HOSPITAL_COMMUNITY): Payer: Self-pay | Admitting: *Deleted

## 2021-11-18 ENCOUNTER — Inpatient Hospital Stay (HOSPITAL_COMMUNITY): Admission: RE | Admit: 2021-11-18 | Payer: PPO | Source: Ambulatory Visit

## 2021-11-24 DIAGNOSIS — G4733 Obstructive sleep apnea (adult) (pediatric): Secondary | ICD-10-CM | POA: Diagnosis not present

## 2021-12-15 DIAGNOSIS — G4733 Obstructive sleep apnea (adult) (pediatric): Secondary | ICD-10-CM | POA: Diagnosis not present

## 2021-12-16 DIAGNOSIS — H00015 Hordeolum externum left lower eyelid: Secondary | ICD-10-CM | POA: Diagnosis not present

## 2021-12-30 ENCOUNTER — Ambulatory Visit: Payer: PPO | Admitting: Adult Health

## 2022-01-13 DIAGNOSIS — H00015 Hordeolum externum left lower eyelid: Secondary | ICD-10-CM | POA: Diagnosis not present

## 2022-01-13 DIAGNOSIS — H01009 Unspecified blepharitis unspecified eye, unspecified eyelid: Secondary | ICD-10-CM | POA: Diagnosis not present

## 2022-02-09 ENCOUNTER — Ambulatory Visit: Payer: PPO | Admitting: Neurology

## 2022-02-09 ENCOUNTER — Encounter: Payer: Self-pay | Admitting: Neurology

## 2022-02-09 ENCOUNTER — Telehealth: Payer: Self-pay | Admitting: Neurology

## 2022-02-09 VITALS — BP 127/71 | HR 58 | Wt 231.0 lb

## 2022-02-09 DIAGNOSIS — R251 Tremor, unspecified: Secondary | ICD-10-CM | POA: Diagnosis not present

## 2022-02-09 DIAGNOSIS — G969 Disorder of central nervous system, unspecified: Secondary | ICD-10-CM | POA: Diagnosis not present

## 2022-02-09 MED ORDER — CARBIDOPA-LEVODOPA 25-100 MG PO TABS: 1.0000 | ORAL_TABLET | Freq: Three times a day (TID) | ORAL | 2 refills | Status: DC

## 2022-02-09 MED ORDER — ALPRAZOLAM 2 MG PO TABS: ORAL_TABLET | ORAL | 0 refills | Status: DC
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
## 2022-02-09 NOTE — Addendum Note (Signed)
Addended by: Larey Seat on: 02/09/2022 10:39 AM   Modules accepted: Orders

## 2022-02-09 NOTE — Telephone Encounter (Signed)
Health team no auth req- sent order to [facility], they will reach out to the patient to schedule.

## 2022-02-09 NOTE — Patient Instructions (Addendum)
Right sided Tremor   Tremor A tremor is trembling or shaking that you cannot control. Most tremors affect the hands or arms. Tremors can also affect the head, vocal cords, face, and other parts of the body. There are many types of tremors. Common types include: Essential tremor. These usually occur in people older than 40. It may run in families and can happen in otherwise healthy people. Resting tremor. These occur when the muscles are at rest, such as when your hands are resting in your lap. People with Parkinson's disease often have resting tremors. Postural tremor. These occur when you try to hold a pose, such as keeping your hands outstretched. Kinetic tremor. These occur during purposeful movement, such as trying to touch a finger to your nose. Task-specific tremor. These may occur when you perform certain tasks such as writing, speaking, or standing. Psychogenic tremor. These dramatically lessen or disappear when you are distracted. They can happen in people of all ages. Some types of tremors have no known cause. Tremors can also be a symptom of nervous system problems (neurological disorders) that may occur with aging. Some tremors go away with treatment, while others do not. Follow these instructions at home: Lifestyle   Limit alcohol intake to no more than 1 drink a day for nonpregnant women and 2 drinks a day for men. One drink equals 12 oz of beer, 5 oz of wine, or 1 oz of hard liquor. Do not use any products that contain nicotine or tobacco, such as cigarettes and e-cigarettes. If you need help quitting, ask your health care provider. Avoid extreme heat and extreme cold. Limit your caffeine intake, as told by your health care provider. Try to get 8 hours of sleep each night. Find ways to manage your stress, such as meditation or yoga. General instructions Take over-the-counter and prescription medicines only as told by your health care provider. Keep all follow-up visits as told  by your health care provider. This is important. Contact a health care provider if you: Develop a tremor after starting a new medicine. Have a tremor along with other symptoms such as: Numbness. Tingling. Pain. Weakness. Notice that your tremor gets worse. Notice that your tremor interferes with your day-to-day life. Summary A tremor is trembling or shaking that you cannot control. Most tremors affect the hands or arms. Some types of tremors have no known cause. Others may be a symptom of nervous system problems (neurological disorders). Make sure you discuss any tremors you have with your health care provider. This information is not intended to replace advice given to you by your health care provider. Make sure you discuss any questions you have with your health care provider. Document Revised: 08/27/2020 Document Reviewed: 08/29/2020 Elsevier Patient Education  2022 [place]. tremor, mild, low amplitude, but tremor can become pill rolling type when under stress.  Loss of arm swing, cogwheeling and masked face. Gait is robotic. Stooped posture when observed.   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     My Plan is to proceed with:  1) DAT scan under xanax 2 times 2 mg , claustrophobia warning. Wife wants to be in the room.  2) propranolol can be continued.  3) start sinemet 25/ 100 mg bid first, than after 30 days go to tid. Take 30 minutes before meals. 4) follow yearly for CPAP     I would like to thank  [name], [place],  [place] [address] for allowing me to  meet with and to take care of this pleasant patient.    I plan to follow up either personally or through our NP within 2-4 months.

## 2022-02-16 ENCOUNTER — Inpatient Hospital Stay (HOSPITAL_COMMUNITY): Admission: RE | Admit: 2022-02-16 | Payer: PPO | Source: Ambulatory Visit

## 2022-02-16 ENCOUNTER — Encounter (HOSPITAL_COMMUNITY): Payer: Self-pay

## 2022-02-24 ENCOUNTER — Encounter: Payer: Self-pay | Admitting: Neurology

## 2022-02-25 ENCOUNTER — Encounter (HOSPITAL_COMMUNITY)
Admission: RE | Admit: 2022-02-25 | Discharge: 2022-02-25 | Disposition: A | Discharge status: Home / Self Care | Payer: PPO | Source: Ambulatory Visit | Attending: Neurology | Admitting: Neurology

## 2022-02-25 ENCOUNTER — Other Ambulatory Visit: Payer: Self-pay

## 2022-02-25 DIAGNOSIS — G969 Disorder of central nervous system, unspecified: Secondary | ICD-10-CM

## 2022-02-25 DIAGNOSIS — R251 Tremor, unspecified: Secondary | ICD-10-CM | POA: Diagnosis not present

## 2022-02-25 DIAGNOSIS — G20 Parkinson's disease: Secondary | ICD-10-CM | POA: Diagnosis not present

## 2022-02-25 MED ORDER — POTASSIUM IODIDE (ANTIDOTE) 130 MG PO TABS: 130.0000 mg | ORAL_TABLET | Freq: Once | ORAL | Status: DC

## 2022-02-25 MED ORDER — IOFLUPANE I 123 185 MBQ/2.5ML IV SOLN
4.7000 | Freq: Once | INTRAVENOUS | Status: AC | PRN
  Administered 2022-02-25: 10:00:00 4.7 via INTRAVENOUS
  Filled 2022-02-25: qty 5

## 2022-02-25 MED ORDER — POTASSIUM IODIDE (ANTIDOTE) 130 MG PO TABS
ORAL_TABLET | ORAL | Status: AC
  Administered 2022-02-25: 09:00:00 130 mg via ORAL
  Filled 2022-02-25: qty 1

## 2022-02-25 NOTE — Progress Notes (Signed)
There is decreased radiotracer activity in the posterior ?LEFT putamen. There is mild decrease in radiotracer activity in the ?head of the LEFT caudate nucleus compared to the RIGHT although the ?activity in LEFT remains fairly intense. ?? ?IMPRESSION: ?Decreased radiotracer activity in the posterior LEFT putamen is a ?pattern concerning for early Parkinson's syndrome pathology. The patient presented with right hand tremor which would correlate to left brain findings. ? ?Plan: I suggest a trial of Carbidopa- Levodopa. 25/100 MG tid .
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
## 2022-02-26 ENCOUNTER — Encounter: Payer: Self-pay | Admitting: Neurology

## 2022-02-26 ENCOUNTER — Encounter: Payer: Self-pay | Admitting: Cardiology

## 2022-02-26 ENCOUNTER — Ambulatory Visit: Payer: PPO | Admitting: Cardiology

## 2022-02-26 VITALS — BP 130/72 | HR 58 | Ht 70.0 in | Wt 234.2 lb

## 2022-02-26 DIAGNOSIS — E785 Hyperlipidemia, unspecified: Secondary | ICD-10-CM | POA: Diagnosis not present

## 2022-02-26 DIAGNOSIS — R931 Abnormal findings on diagnostic imaging of heart and coronary circulation: Secondary | ICD-10-CM | POA: Diagnosis not present

## 2022-02-26 DIAGNOSIS — E118 Type 2 diabetes mellitus with unspecified complications: Secondary | ICD-10-CM | POA: Diagnosis not present

## 2022-02-26 DIAGNOSIS — G4730 Sleep apnea, unspecified: Secondary | ICD-10-CM

## 2022-02-26 MED ORDER — EZETIMIBE 10 MG PO TABS
10.0000 mg | ORAL_TABLET | Freq: Every day | ORAL | 3 refills | Status: DC
Start: 2022-02-26 — End: 2022-07-20

## 2022-02-26 NOTE — Patient Instructions (Addendum)
Medication Instructions:  ?START EZETIMIBE  10 MG DAILY  ? ?*If you need a refill on your cardiac medications before your next appointment, please call your pharmacy* ? ?Lab Work: ?FASTING LABS AT YOUR PRIMARY WHEN YOU ARE SEEN  ? ?Testing/Procedures: ?NONE ? ?Follow-Up: ?At [facility], you and your health needs are our priority.  As part of our continuing mission to provide you with exceptional heart care, we have created designated Provider Care Teams.  These Care Teams include your primary Cardiologist (physician) and Advanced Practice Providers (APPs -  Physician Assistants and Nurse Practitioners) who all work together to provide you with the care you need, when you need it. ? ?We recommend signing up for the patient portal called "MyChart".  Sign up information is provided on this After Visit Summary.  MyChart is used to connect with patients for Virtual Visits (Telemedicine).  Patients are able to view lab/test results, encounter notes, upcoming appointments, etc.  Non-urgent messages can be sent to your provider as well.   ?To learn more about what you can do with MyChart, go to [email or web address].   ? ?Your next appointment:   ?12 month(s) ? ?The format for your next appointment:   ?In Person ? ?Provider:   ?DR Percival Spanish  ?

## 2022-02-28 ENCOUNTER — Other Ambulatory Visit: Payer: Self-pay | Admitting: Neurology

## 2022-03-11 DIAGNOSIS — H00015 Hordeolum externum left lower eyelid: Secondary | ICD-10-CM | POA: Diagnosis not present

## 2022-03-11 DIAGNOSIS — H01009 Unspecified blepharitis unspecified eye, unspecified eyelid: Secondary | ICD-10-CM | POA: Diagnosis not present

## 2022-04-05 ENCOUNTER — Encounter: Payer: Self-pay | Admitting: Neurology

## 2022-04-05 ENCOUNTER — Ambulatory Visit: Payer: PPO | Admitting: Neurology

## 2022-04-05 ENCOUNTER — Telehealth: Payer: Self-pay | Admitting: Neurology

## 2022-04-05 VITALS — BP 144/70 | HR 54 | Ht 70.0 in | Wt 232.5 lb

## 2022-04-05 DIAGNOSIS — R251 Tremor, unspecified: Secondary | ICD-10-CM

## 2022-04-05 DIAGNOSIS — G969 Disorder of central nervous system, unspecified: Secondary | ICD-10-CM | POA: Diagnosis not present

## 2022-04-05 DIAGNOSIS — E1151 Type 2 diabetes mellitus with diabetic peripheral angiopathy without gangrene: Secondary | ICD-10-CM

## 2022-04-05 DIAGNOSIS — G20 Parkinson's disease: Secondary | ICD-10-CM

## 2022-04-05 DIAGNOSIS — E119 Type 2 diabetes mellitus without complications: Secondary | ICD-10-CM

## 2022-04-05 DIAGNOSIS — I70209 Unspecified atherosclerosis of native arteries of extremities, unspecified extremity: Secondary | ICD-10-CM
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
## 2022-04-05 NOTE — Telephone Encounter (Signed)
Referral sent to Dr. Linus Mako [phone]. ?

## 2022-04-13 DIAGNOSIS — L820 Inflamed seborrheic keratosis: Secondary | ICD-10-CM | POA: Diagnosis not present

## 2022-04-13 DIAGNOSIS — D2371 Other benign neoplasm of skin of right lower limb, including hip: Secondary | ICD-10-CM | POA: Diagnosis not present

## 2022-04-13 DIAGNOSIS — L812 Freckles: Secondary | ICD-10-CM | POA: Diagnosis not present

## 2022-04-13 DIAGNOSIS — D225 Melanocytic nevi of trunk: Secondary | ICD-10-CM | POA: Diagnosis not present

## 2022-04-13 DIAGNOSIS — L821 Other seborrheic keratosis: Secondary | ICD-10-CM | POA: Diagnosis not present

## 2022-04-13 DIAGNOSIS — D1801 Hemangioma of skin and subcutaneous tissue: Secondary | ICD-10-CM | POA: Diagnosis not present

## 2022-04-13 DIAGNOSIS — L570 Actinic keratosis: Secondary | ICD-10-CM | POA: Diagnosis not present

## 2022-04-23 DIAGNOSIS — G4733 Obstructive sleep apnea (adult) (pediatric): Secondary | ICD-10-CM | POA: Diagnosis not present

## 2022-04-30 DIAGNOSIS — E1165 Type 2 diabetes mellitus with hyperglycemia: Secondary | ICD-10-CM | POA: Diagnosis not present

## 2022-04-30 DIAGNOSIS — G20 Parkinson's disease: Secondary | ICD-10-CM | POA: Diagnosis not present

## 2022-04-30 DIAGNOSIS — I2584 Coronary atherosclerosis due to calcified coronary lesion: Secondary | ICD-10-CM | POA: Diagnosis not present

## 2022-04-30 DIAGNOSIS — D696 Thrombocytopenia, unspecified: Secondary | ICD-10-CM | POA: Diagnosis not present

## 2022-04-30 DIAGNOSIS — I10 Essential (primary) hypertension: Secondary | ICD-10-CM | POA: Diagnosis not present

## 2022-04-30 DIAGNOSIS — E669 Obesity, unspecified: Secondary | ICD-10-CM | POA: Diagnosis not present

## 2022-04-30 DIAGNOSIS — E785 Hyperlipidemia, unspecified: Secondary | ICD-10-CM | POA: Diagnosis not present

## 2022-04-30 DIAGNOSIS — N401 Enlarged prostate with lower urinary tract symptoms: Secondary | ICD-10-CM | POA: Diagnosis not present

## 2022-04-30 DIAGNOSIS — J309 Allergic rhinitis, unspecified: Secondary | ICD-10-CM | POA: Diagnosis not present

## 2022-04-30 DIAGNOSIS — I2510 Atherosclerotic heart disease of native coronary artery without angina pectoris: Secondary | ICD-10-CM | POA: Diagnosis not present

## 2022-04-30 DIAGNOSIS — E291 Testicular hypofunction: Secondary | ICD-10-CM | POA: Diagnosis not present

## 2022-04-30 DIAGNOSIS — Z8249 Family history of ischemic heart disease and other diseases of the circulatory system: Secondary | ICD-10-CM | POA: Diagnosis not present

## 2022-06-15 ENCOUNTER — Encounter: Payer: Self-pay | Admitting: Neurology

## 2022-06-15 ENCOUNTER — Ambulatory Visit: Payer: PPO | Admitting: Neurology

## 2022-06-15 VITALS — BP 133/63 | HR 52 | Ht 70.0 in | Wt 227.0 lb

## 2022-06-15 DIAGNOSIS — G629 Polyneuropathy, unspecified: Secondary | ICD-10-CM | POA: Diagnosis not present

## 2022-06-15 DIAGNOSIS — G20 Parkinson's disease: Secondary | ICD-10-CM | POA: Diagnosis not present

## 2022-06-15 DIAGNOSIS — F519 Sleep disorder not due to a substance or known physiological condition, unspecified: Secondary | ICD-10-CM | POA: Diagnosis not present

## 2022-06-15 MED ORDER — ALPHA-LIPOIC ACID 600 MG PO CAPS
1200.0000 mg | ORAL_CAPSULE | Freq: Every day | ORAL | 1 refills | Status: AC
Start: 2022-06-15 — End: ?

## 2022-06-15 MED ORDER — CARBIDOPA-LEVODOPA 25-100 MG PO TABS: 1.0000 | ORAL_TABLET | Freq: Four times a day (QID) | ORAL | 1 refills | Status: DC

## 2022-06-16 ENCOUNTER — Encounter: Payer: Self-pay | Admitting: Neurology

## 2022-06-16 ENCOUNTER — Telehealth: Payer: Self-pay | Admitting: Neurology

## 2022-06-16 NOTE — Telephone Encounter (Signed)
Referral for Neurology sent to [facility] [phone].

## 2022-07-16 NOTE — Progress Notes (Unsigned)
Assessment/Plan:   Parkinsonism.  I suspect that this does represent idiopathic Parkinson's disease.  The patient has tremor, bradykinesia, rigidity and mild postural instability.  -We discussed the diagnosis as well as pathophysiology of the disease.  We discussed treatment options as well as prognostic indicators.  Patient education was provided.  -We discussed that it used to be thought that levodopa would increase risk of melanoma but now it is believed that Parkinsons itself likely increases risk of melanoma. he is to get regular skin checks.  -  We talked about medication options as well as potential future surgical options.  We talked about safety in the home.  -Discussed timing of his levodopa.  I recommended he move the timing earlier in the day so that he takes carbidopa/levodopa 25/100, 2 tablet at 7 AM/1 at 11 AM/1 at4 PM.  -We discussed community resources in the area including patient support groups and community exercise programs for PD and pt education was provided to the patient.  He met with my LCSW today.  -Patient is on propranolol specifically for tremor.  He asked me about that.  I told him I probably would not recommend it in him.  He already has very low blood pressure and his bradycardia.  In addition, Parkinson's patients can become easily orthostatic.  2.  Hx of cerebral abscess right occiput  - status post surgical intervention in 2007 with Dr. Venetia Maxon  -unknown etiology, but no sequela  3.  Diabetic pn  -Discussed that this, along with Parkinson's, certainly can affect gait and balance.  Controlling her diabetes, as well as exercise will be very important.  4.  Sleep apnea  -Patient reports being faithful with CPAP.  5.  F/u here or with Dr. Vickey Huger, whichever pt prefers  Subjective:   Lawrence Johnson was seen today in the movement disorders clinic for neurologic consultation at the request of Dohmeier, Porfirio Mylar, MD.   Pt with wife today who supplements hx.   patient has been a patient of [facility] since August, 2021.  Initial consultation was for sleep apnea.  He f/u in Jan 2022 and they discussed tremor and it was thought that there was maybe ET per patient.  He was then seen by Dr. Vickey Huger for tremor in February, 2023.  It was felt he likely had Parkinson's disease at that visit.  He was started on levodopa and referred for DaTscan.  DaTscan was completed on February 25, 2022.  There is decreased radiotracer activity in the left putamen.  He followed back up April 17, at which point a referral to Dr. Rubin Payor was placed, along with therapy referrals.  He followed back on June 27 for CPAP and a second opinion referral was placed here.  Pt is currently taking carbidopa/levodopa, 2 at 6am, 1 tablet at 2pm, 1 tablet at 10pm.  He feels "better" on the medication and wife notes improved arm swing.   Specific Symptoms:  Tremor: Yes.  , hasn't noted that levodopa helps tremor significantly.  Only notes tremor in the R arm and occ R foot.  He is R hand dominant Family hx of similar:  fathers brother with ET.  No fam hx of Parkinsons Disease  Voice: little weaker and slight "crackle"; told by ent may be due to GERD Sleep: wears cpap  Vivid Dreams:  No.  Acting out dreams:  No. Wet Pillows: No. Postural symptoms:  Yes.  , doesn't feel as good as it was but better since taking  carbidopa/levodopa  Falls?  No. Bradykinesia symptoms: shuffling gait (better since starting levodopa); not slow with ambulation; no trouble getting up Loss of smell:  No. Loss of taste:  No. Urinary Incontinence:  No. Difficulty Swallowing:  No. Handwriting, micrographia: Yes.   But better now with carbidopa/levodopa Trouble with ADL's:  No.  Trouble buttoning clothing: No. Depression:  No. Memory changes:  thinks not as good as it used to be; used to have photographic memory and doesn't  Hallucinations:  No.  visual distortions: No. (Has had floaters) N/V:  No. Lightheaded:   No.  Syncope: No. Diplopia:  No. Dyskinesia:  No. Prior exposure to reglan/antipsychotics: No.  Last neuroimaging was in 2007 after he had brain abscess.    PREVIOUS MEDICATIONS: Sinemet  ALLERGIES:   Allergies  Allergen Reactions   Dilantin [Phenytoin] Rash    Fever   Ezetimibe-Simvastatin Other (See Comments)    Myalgias    CURRENT MEDICATIONS:  Current Meds  Medication Sig   Alpha-Lipoic Acid 600 MG CAPS Take 2 capsules (1,200 mg total) by mouth daily.   aspirin 81 MG tablet Take 81 mg by mouth daily.   carbidopa-levodopa (SINEMET IR) 25-100 MG tablet Take 1 tablet by mouth 4 (four) times daily. 2 in AM, one at lunchtime, one at dinner. (Patient taking differently: Take 1 tablet by mouth 3 (three) times daily. 2 in AM, one at lunchtime, one at dinner/bedtime (30 min prior to meals).)   cholecalciferol (VITAMIN D3) 25 MCG (1000 UNIT) tablet Take 1,000 Units by mouth daily.   cyanocobalamin (VITAMIN B12) 500 MCG tablet Take 500 mcg by mouth daily.   fluticasone (FLONASE) 50 MCG/ACT nasal spray Place 1 spray into both nostrils daily.   JARDIANCE 10 MG TABS tablet Take 10 mg by mouth daily.   loratadine (CLARITIN) 10 MG tablet Take 10 mg by mouth daily as needed.   magnesium citrate SOLN Take 1 Bottle by mouth once.   metFORMIN (GLUMETZA) 500 MG (MOD) 24 hr tablet Take 500 mg by mouth daily with breakfast.   propranolol (INDERAL) 20 MG tablet Take 20 mg by mouth 2 (two) times daily.   rosuvastatin (CRESTOR) 20 MG tablet Take 10 mg by mouth as directed. TAKE 1/2 TABLET DAILY   testosterone (ANDROGEL) 50 MG/5GM (1%) GEL Place 5 g onto the skin daily.   valsartan (DIOVAN) 160 MG tablet Take 160 mg by mouth daily.     Objective:   VITALS:   Vitals:   07/20/22 0934  BP: 108/60  Pulse: (!) 54  SpO2: 97%  Weight: 230 lb 6.4 oz (104.5 kg)  Height: $Remove'5\' 10"'tGEqzMb$  (1.778 m)    GEN:  The patient appears stated age and is in NAD. HEENT:  Normocephalic, atraumatic.  The mucous membranes  are moist. The superficial temporal arteries are without ropiness or tenderness. CV:  brady.  regular Lungs:  CTAB Neck/HEME:  There are no carotid bruits bilaterally.  Neurological examination:  Orientation: The patient is alert and oriented x3.  Cranial nerves: There is good facial symmetry. Extraocular muscles are intact. The visual fields are full to confrontational testing. The speech is fluent and clear. Soft palate rises symmetrically and there is no tongue deviation. Hearing is intact to conversational tone. Sensation: Sensation is intact to light and pinprick throughout (facial, trunk, extremities). Vibration is intact at the bilateral big toe but it is decreased distally. There is no extinction with double simultaneous stimulation. There is no sensory dermatomal level identified. Motor: Strength is 5/5 in  the bilateral upper and lower extremities.   Shoulder shrug is equal and symmetric.  There is no pronator drift. Deep tendon reflexes: Deep tendon reflexes are 1/4 at the bilateral biceps, triceps, brachioradialis, patella and achilles. Plantar responses are downgoing bilaterally.  Movement examination: Tone: There is normal tone in the bilateral upper extremities.  The tone in the lower extremities is normal.  Abnormal movements: there is mild RUE rest tremor that increases with distraction.  There is mild RLE tremor.   Coordination:  There is mild decremation with RAM's, on the right only.  Gait and Station: The patient has no difficulty arising out of a deep-seated chair without the use of the hands. The patient's stride length is good.  The patient has a neg pull test.     I have reviewed and interpreted the following labs independently   Chemistry      Component Value Date/Time   NA 132 (L) 01/20/2016 1022   K 4.8 01/20/2016 1022   CL 96 01/20/2016 1022   CO2 26 01/20/2016 1022   BUN 23 01/20/2016 1022   CREATININE 0.95 01/20/2016 1022      Component Value Date/Time    CALCIUM 9.7 01/20/2016 1022   ALKPHOS 60 01/20/2016 1022   AST 75 (H) 01/20/2016 1022   ALT 102 (H) 01/20/2016 1022   BILITOT 0.6 01/20/2016 1022      No results found for: "TSH" Lab Results  Component Value Date   WBC 6.4 01/20/2016   HGB 15.4 01/20/2016   HCT 45.3 01/20/2016   MCV 87.0 01/20/2016   PLT 155.0 01/20/2016     Total time spent on today's visit was 65 minutes, including both face-to-face time and nonface-to-face time.  Time included that spent on review of records (prior notes available to me/labs/imaging if pertinent), discussing treatment and goals, answering patient's questions and coordinating care.  Cc:  [name], MD

## 2022-07-20 ENCOUNTER — Encounter: Payer: Self-pay | Admitting: Neurology

## 2022-07-20 ENCOUNTER — Ambulatory Visit: Payer: PPO | Admitting: Neurology

## 2022-07-20 VITALS — BP 108/60 | HR 54 | Ht 70.0 in | Wt 230.4 lb

## 2022-07-20 DIAGNOSIS — G20 Parkinson's disease: Secondary | ICD-10-CM | POA: Diagnosis not present

## 2022-07-20 DIAGNOSIS — Z9989 Dependence on other enabling machines and devices: Secondary | ICD-10-CM

## 2022-07-20 DIAGNOSIS — G4733 Obstructive sleep apnea (adult) (pediatric): Secondary | ICD-10-CM

## 2022-07-20 MED ORDER — CARBIDOPA-LEVODOPA 25-100 MG PO TABS: ORAL_TABLET | ORAL | 1 refills | Status: DC

## 2022-07-20 NOTE — Patient Instructions (Signed)
Local and Online Resources for Power over Parkinson's Group June 2023  LOCAL [place] PARKINSON'S GROUPS  Power over Parkinson's Group:   Power Over Parkinson's Patient Education Group will be Wednesday, June 14th-*Hybrid meting*- in person at [place] [facility] location and via WEBEX at 2:00 pm.   Upcoming Power over Parkinson's Meetings:  2nd Wednesdays of the month at 2 pm:   June 14th, July 12th Contact Amy Marriott at [email or web address] if interested in participating in this group Parkinson's Care Partners Group:    3rd Mondays, Contact Misty Paladino Atypical Parkinsonian Patient Group:   4th Wednesdays, Contact Misty Paladino If you are interested in participating in these groups with Misty, please contact her directly for how to join those meetings.  Her contact information is [email or web address].    LOCAL EVENTS AND NEW OFFERINGS Dance Class for People with Parkinson's at [employer].  Friday, June 9th at 2 pm.  Led by [employer] DPT students.  Contact [email or web address] to register or with questions. Ice Cream Social at [employer]!  Thursday, June 15th, 5:30-7:00 pm.  RSVP to [email or web address] for attendance and free ice cream. Parkinson's T-shirts for sale!  Designed by a local group member, with funds going to Movement Disorders Fund.  $25.00  Contact Misty to purchase  New PWR! Moves Community Fitness Instructor-Led Class offering at [employer]!  Wednesdays 1-2 pm, starting April 12th.   Contact Susan Laney, Fitness Manager at [employer].  [email or web address]  ONLINE EDUCATION AND SUPPORT Parkinson Foundation:  www.parkinson.org PD Health at Home continues:  Mindfulness Mondays, Wellness Wednesdays, Fitness Fridays  Upcoming Education: Parkinson's 101:  What You and Your Family Should Know.  Wednesday, June 7th at 1:00 pm Register for expert briefings (webinars) at  https://www.[email or web address].org/resources-support/online-education/expert-briefings-webinars Please check out their website to sign up for emails and see their full online offerings   [employer] Foundation:  www.michaeljfox.org  Third Thursday Webinars:  On the third Thursday of every month at 12 p.m. ET, join our free live webinars to learn about various aspects of living with Parkinson's disease and our work to speed medical breakthroughs. Upcoming Webinar: REPLAY:  From Low Blood Pressure to Bladder Problems:  A Look at Lesser Known Parkinson's Symptoms.  Thursday, June 15th at 12 noon. Check out additional information on their website to see their full online offerings  [employer] Phin[employer] Foundation:  www.davisphinneyfoundation.org Upcoming Webinar:   Stay tuned Webinar Series:  Living with Parkinson's Meetup.   Third Thursdays each month, 3 pm Care Partner Monthly Meetup.  With Connie Carpenter Phinney.  First Tuesday of each month, 2 pm Check out additional information to Live Well Today on their website  Parkinson and Movement Disorders (PMD) Alliance:  www.pmdalliance.org [employer] Online:  Online Education Events Sign up for emails, which are sent weekly to give you updates on programming and online offerings  Parkinson's Association of [place]:  www.parkinsonassociation.org Information on online support groups, education events, and online exercises including Yoga, Parkinson's exercises and more-LOTS of information on links to PD resources and online events Virtual Support Group through Parkinson's Association of [place]; next one is scheduled for Wednesday, June 7th at 2 pm. (These are typically scheduled for the 1st Wednesday of the month at 2 pm).  Visit website for details. Save the date for "Caring for Parkinson's-Caring for You", 9th Annual Symposium.  In-person event in [place].  September 9th.  More info on registration to come. MOVEMENT AND EXERCISE OPPORTUNITIES PWR!  Moves Classes at [facility].  Wednesdays 10 and 11   am.   Contact Amy Marriott, PT [email or web address] if interested. NEW PWR! Moves Class offering at [employer].  Wednesdays 1-2 pm, starting April 12th.  Contact Susan Laney, Fitness Manager at [employer].  [email or web address] Here is a link to the PWR!Moves classes on Zoom from [place] Parkinson's Foundation - Daily Mon-Sat at 10:00. Via Zoom, FREE and open to all.  There is also a link below via Facebook if you use that platform.  https://www.parkinsonsmi.org/mpf-programs/exercise-and-movement-activities https://www.facebook.com/ParkinsonsMI.org/posts/pwr-moves-exercise-class-parkinson-wellness-recovery-online-with-angee-ludwa-pt-/10156827878021813/  Parkinson's Wellness Recovery (PWR! Moves)  www.pwr4life.org Info on the PWR! Virtual Experience:  You will have access to our expertise through self-assessment, guided plans that start with the PD-specific fundamentals, educational content, tips, Q&A with an expert, and a growing library of PD-specific pre-recorded and live exercise classes of varying types and intensity - both physical and cognitive! If that is not enough, we offer 1:1 wellness consultations (in-person or virtual) to personalize your PWR! Virtual Experience.  Parkinson Foundation Fitness Fridays:  As part of the PD Health @ Home program, this free video series focuses each week on one aspect of fitness designed to support people living with Parkinson's.  These weekly videos highlight the Parkinson Foundation recent fitness guidelines for people with Parkinson's disease. www.parkinson.org/resources-support/online-education/pdhealth#ff Dance for PD website is offering free, live-stream classes throughout the week, as well as links to digital library of classes:  https://danceforparkinsons.org/ Virtual dance and Pilates for Parkinson's classes: Click on the Community Tab> Parkinson's Movement Initiative  Tab.  To register for classes and for more information, visit www.americandancefestival.org and click the "community" tab.  [employer] Parkinson's Cycling Classes  [employer]:  Thursdays @ Noon-Live classes at [employer] (Contact Margaret Hazen at [email or web address] or [phone]) [employer]: Virtual Classes Mondays and Thursdays /Live classes Tuesday, Wednesday and Thursday (contact Marlee at [email or web address]  or [phone]) [place] [employer]ady Boxing Varied levels of classes are offered Tuesdays and Thursdays at [employer].  Stretching with Maria weekly class is also offered for people with Parkinson's To observe a class or for more information, call [phone] or email Hillary Savage at [email or web address] ADDITIONAL SUPPORT AND RESOURCES [employer]:Online Caregiver Education Opportunities:  www.well-springsolutions.org/caregiver-education/caregiver-support-group.  You may also contact Jodi Kolada at [email or web address] or [phone].    [facility] Navigator:  Just1Navigator program, a free service to help individuals and families through the journey of determining care for older adults.  The "Navigator" is a social worker, Nicole Reynolds, who will speak with a prospective client and/or loved ones to provide an assessment of the situation and a set of recommendations for a personalized care plan -- all free of charge, and whether [facility] [employer] offers the needed service or not. If the need is not a service we provide, we are well-connected with reputable programs in town that we can refer you to.  www.well-springsolutions.org or to speak with the Navigator, call [phone]. Family Caregiver Programming in June:  [employer], Thursday, June 15th 11-12:30 at [employer], [place].  Call [phone] to register  

## 2022-08-03 ENCOUNTER — Other Ambulatory Visit: Payer: Self-pay

## 2022-08-03 ENCOUNTER — Encounter: Payer: Self-pay | Admitting: Physical Therapy

## 2022-08-03 ENCOUNTER — Ambulatory Visit: Payer: PPO | Attending: Neurology | Admitting: Physical Therapy

## 2022-08-03 DIAGNOSIS — R2681 Unsteadiness on feet: Secondary | ICD-10-CM | POA: Insufficient documentation

## 2022-08-03 DIAGNOSIS — R2689 Other abnormalities of gait and mobility: Secondary | ICD-10-CM | POA: Diagnosis not present
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
## 2022-08-05 DIAGNOSIS — G4733 Obstructive sleep apnea (adult) (pediatric): Secondary | ICD-10-CM | POA: Diagnosis not present

## 2022-08-10 ENCOUNTER — Encounter: Payer: Self-pay | Admitting: Physical Therapy

## 2022-08-10 ENCOUNTER — Ambulatory Visit: Payer: PPO | Admitting: Physical Therapy

## 2022-08-10 DIAGNOSIS — R2689 Other abnormalities of gait and mobility: Secondary | ICD-10-CM

## 2022-08-10 DIAGNOSIS — R2681 Unsteadiness on feet: Secondary | ICD-10-CM
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
## 2022-08-17 ENCOUNTER — Ambulatory Visit: Payer: PPO | Admitting: Physical Therapy

## 2022-08-17 ENCOUNTER — Encounter: Payer: Self-pay | Admitting: Physical Therapy

## 2022-08-17 DIAGNOSIS — R2689 Other abnormalities of gait and mobility: Secondary | ICD-10-CM

## 2022-08-17 DIAGNOSIS — R2681 Unsteadiness on feet: Secondary | ICD-10-CM
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
## 2022-08-25 ENCOUNTER — Encounter: Payer: Self-pay | Admitting: Physical Therapy

## 2022-08-25 ENCOUNTER — Ambulatory Visit: Payer: PPO | Attending: Neurology | Admitting: Physical Therapy

## 2022-08-25 ENCOUNTER — Telehealth: Payer: Self-pay | Admitting: Physical Therapy

## 2022-08-25 DIAGNOSIS — G20 Parkinson's disease: Secondary | ICD-10-CM

## 2022-08-25 DIAGNOSIS — R2689 Other abnormalities of gait and mobility: Secondary | ICD-10-CM | POA: Diagnosis not present

## 2022-08-25 DIAGNOSIS — R2681 Unsteadiness on feet: Secondary | ICD-10-CM | POA: Diagnosis not present
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
## 2022-08-25 NOTE — Telephone Encounter (Signed)
Good morning, I have been working with [name] for physical therapy and he is interested in baseline OT and speech evaluations.  He had initial referrals for those from Dr. Brett Fairy, but I think they were closed, as he wanted to focus on physical therapy initially and wanted to wait to see you for his visit.  Could you send updated referrals for OT and speech eval and treat for [name]?  Thank you.  Mady Haagensen, PT 08/25/22 9:26 AM Phone: [phone] Fax: [phone]

## 2022-09-07 ENCOUNTER — Encounter: Payer: Self-pay | Admitting: Physical Therapy

## 2022-09-07 ENCOUNTER — Ambulatory Visit: Payer: PPO | Admitting: Physical Therapy

## 2022-09-07 DIAGNOSIS — R2689 Other abnormalities of gait and mobility: Secondary | ICD-10-CM

## 2022-09-07 DIAGNOSIS — R2681 Unsteadiness on feet: Secondary | ICD-10-CM | POA: Diagnosis not present
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
## 2022-09-21 ENCOUNTER — Ambulatory Visit: Payer: PPO

## 2022-09-21 ENCOUNTER — Other Ambulatory Visit: Payer: Self-pay

## 2022-09-21 ENCOUNTER — Ambulatory Visit: Payer: PPO | Attending: Neurology | Admitting: Occupational Therapy

## 2022-09-21 DIAGNOSIS — R278 Other lack of coordination: Secondary | ICD-10-CM | POA: Diagnosis not present

## 2022-09-21 DIAGNOSIS — R29818 Other symptoms and signs involving the nervous system: Secondary | ICD-10-CM | POA: Insufficient documentation

## 2022-09-21 DIAGNOSIS — R2681 Unsteadiness on feet: Secondary | ICD-10-CM | POA: Diagnosis not present

## 2022-09-21 DIAGNOSIS — R471 Dysarthria and anarthria: Secondary | ICD-10-CM

## 2022-09-21 NOTE — Patient Instructions (Signed)
   LOUD/EXUBERANT SPEECH! -5 loud "ahhhhhhhh" twice a day   Start with comfortably full abdominal breath, hold as long as you comfortably can without squeezing at the end  General Swallow exercise:  10-15 HARD swallows (like swallowing a golf ball) twice a day  Throat clear alternatives: Loud whispered "HA" with hard swallows or sip/hard swallow Sip and hard swallow "Scolding mmmmm-mmmm-mmmmmmm" and hard swallow  Chewing gum to swallow more often and minimize the need to clear throat
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
## 2022-10-05 ENCOUNTER — Ambulatory Visit: Payer: PPO | Admitting: Occupational Therapy

## 2022-10-05 DIAGNOSIS — R2681 Unsteadiness on feet: Secondary | ICD-10-CM

## 2022-10-05 DIAGNOSIS — R278 Other lack of coordination: Secondary | ICD-10-CM | POA: Diagnosis not present

## 2022-10-05 DIAGNOSIS — R29818 Other symptoms and signs involving the nervous system: Secondary | ICD-10-CM
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
## 2022-10-05 NOTE — Patient Instructions (Addendum)
Coordination Exercises  Perform the following exercises for 15 minutes 1-2 times per day. Perform with right hand(s). Perform using big movements.  Flipping Cards: Place deck of cards on the table. Flip cards over by opening your hand big to grasp and then turn your palm up big. Deal cards: Hold 1/2 or whole deck in your hand. Use thumb to push card off top of deck with one big push. Flip card between each finger. Rotate ball with fingertips: Pick up with fingers/thumb and move as much as you can with each turn/movement (clockwise and counter-clockwise). Toss ball from one hand to the other: Toss big/high. Toss ball in the air and catch with the same hand: Toss big/high. Rotate 2 golf balls in your hand: Both directions. Pick up coins and place in coin bank or container: Pick up with big, intentional movements. Do not drag coin to the edge. Pick up coins and stack one at a time: Pick up with big, intentional movements. Do not drag coin to the edge. (5-10 in a stack) Pick up 5-10 coins one at a time and hold in palm. Then, move coins from palm to fingertips one at time and place in coin bank/container. Pick up 5-10 coins one at a time and hold in palm. Then, move coins from palm to fingertips one at a time to stack. Practice writing: Slow down, write big, and focus on forming each letter. Practice typing. Perform "Flicks"/hand stretches (PWR! Hands): Close hands then flick out your fingers with focus on opening hands, pulling wrists back, and extending elbows like you are pushing. Pick up various small objects that are different shapes/sizes and place in container. (paperclips, buttons, keys, dried beans/pasta, coins, screws, nuts/bolts, washers, board game pieces, etc.) Fasten nuts/bolts or put on bottle caps: Turn as much/as big as you can with each turn.

## 2022-10-12 ENCOUNTER — Ambulatory Visit: Payer: PPO | Admitting: Occupational Therapy

## 2022-10-14 ENCOUNTER — Other Ambulatory Visit (HOSPITAL_COMMUNITY): Payer: Self-pay

## 2022-10-14 ENCOUNTER — Ambulatory Visit: Payer: PPO

## 2022-10-14 ENCOUNTER — Other Ambulatory Visit: Payer: Self-pay

## 2022-10-14 DIAGNOSIS — G20B1 Parkinson's disease with dyskinesia, without mention of fluctuations: Secondary | ICD-10-CM

## 2022-10-14 DIAGNOSIS — R278 Other lack of coordination: Secondary | ICD-10-CM | POA: Diagnosis not present

## 2022-10-14 DIAGNOSIS — R1310 Dysphagia, unspecified: Secondary | ICD-10-CM

## 2022-10-14 DIAGNOSIS — R471 Dysarthria and anarthria: Secondary | ICD-10-CM
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
## 2022-10-14 NOTE — Patient Instructions (Signed)
       Write down 10 "everyday" sentences    DO THESE TWICE A DAY  5 loud "ah"   Read everyday sentences in a LOUD voice  Glide "ah" from your lowest pitch to your highest pitch -10 times  Glide "ah" from your highest pitch to your lowest pitch -10 times  10 Effortful swallows

## 2022-10-15 ENCOUNTER — Telehealth (HOSPITAL_COMMUNITY): Payer: Self-pay

## 2022-10-15 NOTE — Telephone Encounter (Signed)
Attempted to contact patient to schedule Modified Barium Swallow - left voicemail. 

## 2022-10-19 ENCOUNTER — Ambulatory Visit: Payer: PPO | Admitting: Occupational Therapy

## 2022-10-19 DIAGNOSIS — R278 Other lack of coordination: Secondary | ICD-10-CM | POA: Diagnosis not present

## 2022-10-19 DIAGNOSIS — R29818 Other symptoms and signs involving the nervous system: Secondary | ICD-10-CM

## 2022-10-19 DIAGNOSIS — R2681 Unsteadiness on feet: Secondary | ICD-10-CM
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
## 2022-10-20 ENCOUNTER — Ambulatory Visit (HOSPITAL_COMMUNITY): Payer: PPO

## 2022-10-20 ENCOUNTER — Encounter (HOSPITAL_COMMUNITY): Payer: PPO

## 2022-10-22 ENCOUNTER — Telehealth (HOSPITAL_COMMUNITY): Payer: Self-pay

## 2022-10-22 NOTE — Telephone Encounter (Signed)
2nd attempt to contact patient to schedule Modified Barium Swallow - left voicemail. 

## 2022-10-26 ENCOUNTER — Ambulatory Visit: Payer: PPO | Attending: Neurology | Admitting: Occupational Therapy

## 2022-10-26 DIAGNOSIS — R471 Dysarthria and anarthria: Secondary | ICD-10-CM | POA: Insufficient documentation

## 2022-10-26 DIAGNOSIS — R29818 Other symptoms and signs involving the nervous system: Secondary | ICD-10-CM | POA: Insufficient documentation

## 2022-10-26 DIAGNOSIS — R2681 Unsteadiness on feet: Secondary | ICD-10-CM | POA: Insufficient documentation

## 2022-10-26 DIAGNOSIS — R278 Other lack of coordination: Secondary | ICD-10-CM | POA: Insufficient documentation

## 2022-10-26 DIAGNOSIS — R1310 Dysphagia, unspecified: Secondary | ICD-10-CM | POA: Diagnosis not present
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
## 2022-10-27 ENCOUNTER — Telehealth: Payer: Self-pay | Admitting: Neurology

## 2022-10-27 ENCOUNTER — Ambulatory Visit: Payer: PPO

## 2022-10-27 DIAGNOSIS — R278 Other lack of coordination: Secondary | ICD-10-CM | POA: Diagnosis not present

## 2022-10-27 DIAGNOSIS — R1310 Dysphagia, unspecified: Secondary | ICD-10-CM

## 2022-10-27 DIAGNOSIS — R471 Dysarthria and anarthria: Secondary | ICD-10-CM

## 2022-10-27 NOTE — Telephone Encounter (Signed)
Pt's wife called in stating she would like to see if Dr. Carles Collet has any resources for spouses? She was hoping to talk with [facility]
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
## 2022-10-29 NOTE — Telephone Encounter (Signed)
Called wife and gave meetings and times on her voicemail and also asked her to call back with any further questions she may have

## 2022-11-02 ENCOUNTER — Ambulatory Visit: Payer: PPO | Admitting: Occupational Therapy

## 2022-11-02 DIAGNOSIS — R29818 Other symptoms and signs involving the nervous system: Secondary | ICD-10-CM

## 2022-11-02 DIAGNOSIS — R2681 Unsteadiness on feet: Secondary | ICD-10-CM

## 2022-11-02 DIAGNOSIS — R278 Other lack of coordination: Secondary | ICD-10-CM | POA: Diagnosis not present

## 2022-11-02 NOTE — Patient Instructions (Signed)
Ways to prevent future Parkinson's related complications: 1.   Exercise regularly,  2.   Focus on BIGGER movements during daily activities- really reach overhead, straighten elbows and extend fingers 3.   When dressing or reaching for your seatbelt make sure to use your body to assist by twisting while you reach-this can help to minimize stress on the shoulder and reduce the risk of a rotator cuff tear 4.   Swing your arms when you walk! People with PD are at increased risk for frozen shoulder and swinging your arms can reduce this risk.  When writing, complete hand flicks, thumb opposition, and/or thumb rolls prior to and during as needed.
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
## 2022-11-10 ENCOUNTER — Ambulatory Visit: Payer: PPO | Admitting: Occupational Therapy

## 2022-11-10 ENCOUNTER — Ambulatory Visit: Payer: PPO

## 2022-11-10 DIAGNOSIS — R471 Dysarthria and anarthria: Secondary | ICD-10-CM

## 2022-11-10 DIAGNOSIS — R2681 Unsteadiness on feet: Secondary | ICD-10-CM

## 2022-11-10 DIAGNOSIS — R1310 Dysphagia, unspecified: Secondary | ICD-10-CM

## 2022-11-10 DIAGNOSIS — R278 Other lack of coordination: Secondary | ICD-10-CM | POA: Diagnosis not present

## 2022-11-10 DIAGNOSIS — R29818 Other symptoms and signs involving the nervous system: Secondary | ICD-10-CM
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
## 2022-11-12 ENCOUNTER — Ambulatory Visit (HOSPITAL_COMMUNITY)
Admission: RE | Admit: 2022-11-12 | Discharge: 2022-11-12 | Disposition: A | Discharge status: Home / Self Care | Payer: PPO | Source: Ambulatory Visit | Attending: Internal Medicine | Admitting: Internal Medicine

## 2022-11-12 DIAGNOSIS — E1140 Type 2 diabetes mellitus with diabetic neuropathy, unspecified: Secondary | ICD-10-CM | POA: Insufficient documentation

## 2022-11-12 DIAGNOSIS — I10 Essential (primary) hypertension: Secondary | ICD-10-CM | POA: Insufficient documentation

## 2022-11-12 DIAGNOSIS — R1310 Dysphagia, unspecified: Secondary | ICD-10-CM | POA: Diagnosis not present

## 2022-11-12 DIAGNOSIS — K047 Periapical abscess without sinus: Secondary | ICD-10-CM | POA: Diagnosis not present

## 2022-11-12 DIAGNOSIS — R0989 Other specified symptoms and signs involving the circulatory and respiratory systems: Secondary | ICD-10-CM | POA: Insufficient documentation

## 2022-11-12 DIAGNOSIS — E291 Testicular hypofunction: Secondary | ICD-10-CM | POA: Diagnosis not present

## 2022-11-12 DIAGNOSIS — G20B1 Parkinson's disease with dyskinesia, without mention of fluctuations: Secondary | ICD-10-CM | POA: Insufficient documentation

## 2022-11-12 DIAGNOSIS — Z1212 Encounter for screening for malignant neoplasm of rectum: Secondary | ICD-10-CM | POA: Diagnosis not present

## 2022-11-12 DIAGNOSIS — G20C Parkinsonism, unspecified: Secondary | ICD-10-CM | POA: Diagnosis not present

## 2022-11-12 DIAGNOSIS — G4733 Obstructive sleep apnea (adult) (pediatric): Secondary | ICD-10-CM | POA: Diagnosis not present

## 2022-11-16 ENCOUNTER — Ambulatory Visit: Payer: PPO | Admitting: Occupational Therapy

## 2022-11-17 DIAGNOSIS — E1165 Type 2 diabetes mellitus with hyperglycemia: Secondary | ICD-10-CM | POA: Diagnosis not present

## 2022-11-17 DIAGNOSIS — E785 Hyperlipidemia, unspecified: Secondary | ICD-10-CM | POA: Diagnosis not present

## 2022-11-17 DIAGNOSIS — I10 Essential (primary) hypertension: Secondary | ICD-10-CM | POA: Diagnosis not present

## 2022-11-19 DIAGNOSIS — Z0000 Encounter for general adult medical examination without abnormal findings: Secondary | ICD-10-CM | POA: Diagnosis not present

## 2022-11-19 DIAGNOSIS — R82998 Other abnormal findings in urine: Secondary | ICD-10-CM | POA: Diagnosis not present

## 2022-11-19 DIAGNOSIS — E669 Obesity, unspecified: Secondary | ICD-10-CM | POA: Diagnosis not present

## 2022-11-19 DIAGNOSIS — I10 Essential (primary) hypertension: Secondary | ICD-10-CM | POA: Diagnosis not present

## 2022-11-19 DIAGNOSIS — E1165 Type 2 diabetes mellitus with hyperglycemia: Secondary | ICD-10-CM | POA: Diagnosis not present

## 2022-11-19 DIAGNOSIS — D696 Thrombocytopenia, unspecified: Secondary | ICD-10-CM | POA: Diagnosis not present

## 2022-11-19 DIAGNOSIS — Z8249 Family history of ischemic heart disease and other diseases of the circulatory system: Secondary | ICD-10-CM | POA: Diagnosis not present

## 2022-11-19 DIAGNOSIS — Z1331 Encounter for screening for depression: Secondary | ICD-10-CM | POA: Diagnosis not present

## 2022-11-19 DIAGNOSIS — I2584 Coronary atherosclerosis due to calcified coronary lesion: Secondary | ICD-10-CM | POA: Diagnosis not present

## 2022-11-19 DIAGNOSIS — I2510 Atherosclerotic heart disease of native coronary artery without angina pectoris: Secondary | ICD-10-CM | POA: Diagnosis not present

## 2022-11-19 DIAGNOSIS — Z1389 Encounter for screening for other disorder: Secondary | ICD-10-CM | POA: Diagnosis not present

## 2022-11-19 DIAGNOSIS — R251 Tremor, unspecified: Secondary | ICD-10-CM | POA: Diagnosis not present

## 2022-11-19 DIAGNOSIS — G20A1 Parkinson's disease without dyskinesia, without mention of fluctuations: Secondary | ICD-10-CM | POA: Diagnosis not present

## 2022-11-19 DIAGNOSIS — E785 Hyperlipidemia, unspecified: Secondary | ICD-10-CM | POA: Diagnosis not present

## 2022-11-19 DIAGNOSIS — N401 Enlarged prostate with lower urinary tract symptoms: Secondary | ICD-10-CM | POA: Diagnosis not present

## 2022-11-23 ENCOUNTER — Ambulatory Visit: Payer: PPO | Attending: Neurology

## 2022-11-23 DIAGNOSIS — R471 Dysarthria and anarthria: Secondary | ICD-10-CM | POA: Insufficient documentation

## 2022-11-23 DIAGNOSIS — R1310 Dysphagia, unspecified: Secondary | ICD-10-CM | POA: Diagnosis not present
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
## 2022-12-17 ENCOUNTER — Other Ambulatory Visit: Payer: Self-pay | Admitting: Cardiology
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
## 2022-12-28 ENCOUNTER — Ambulatory Visit (INDEPENDENT_AMBULATORY_CARE_PROVIDER_SITE_OTHER): Payer: PPO | Admitting: Family Medicine

## 2022-12-28 ENCOUNTER — Encounter: Payer: Self-pay | Admitting: Family Medicine

## 2022-12-28 VITALS — BP 158/82 | HR 64 | Ht 70.0 in | Wt 231.0 lb

## 2022-12-28 DIAGNOSIS — G20B1 Parkinson's disease with dyskinesia, without mention of fluctuations: Secondary | ICD-10-CM

## 2022-12-28 DIAGNOSIS — G4733 Obstructive sleep apnea (adult) (pediatric): Secondary | ICD-10-CM

## 2022-12-28 NOTE — Patient Instructions (Addendum)
Please continue using your CPAP regularly. While your insurance requires that you use CPAP at least 4 hours each night on 70% of the nights, I recommend, that you not skip any nights and use it throughout the night if you can. Getting used to CPAP and staying with the treatment long term does take time and patience and discipline. Untreated obstructive sleep apnea when it is moderate to severe can have an adverse impact on cardiovascular health and raise her risk for heart disease, arrhythmias, hypertension, congestive heart failure, stroke and diabetes. Untreated obstructive sleep apnea causes sleep disruption, nonrestorative sleep, and sleep deprivation. This can have an impact on your day to day functioning and cause daytime sleepiness and impairment of cognitive function, memory loss, mood disturbance, and problems focussing. Using CPAP regularly can improve these symptoms.  We will reach out to [facility] to help get your supply order taken care of. We will reach out once we have more information.   Continue to monitor for air leak. We will keep an eye on your apneic events.   Follow up with Dr Tat as scheduled  Let get you back in with Dr Dohmeier in 6 months

## 2022-12-29 ENCOUNTER — Telehealth: Payer: Self-pay | Admitting: *Deleted

## 2022-12-29 NOTE — Telephone Encounter (Signed)
Spoke with patient and discussed. Will send referral to [facility]. Will also send location/contact info to pt. He states he spoke with them on the phone once recently because they were one of the suggestions [facility] gave him. Pt appreciative.   Referral information and cpap supply order printed for faxing to [facility].

## 2022-12-29 NOTE — Telephone Encounter (Signed)
-----   Message from Debbora Presto, NP sent at 12/28/2022 11:06 AM EST ----- Can you guys assist Mr Lawrence Johnson with obtaining a new DME? He was previously with [facility] and they are no longer operating as DME. He received an email from [facility] asking to set up as DME but he has not returned their call as they are not based locally. Could he use [facility]? I told him we will call with next steps. TY!

## 2022-12-29 NOTE — Telephone Encounter (Signed)
Cpap referral faxed to [facility]. Received a receipt of confirmation.

## 2023-01-04 NOTE — Telephone Encounter (Signed)
Called [facility]. They do have the those orders but they need the CMN sheet signed. I do not see it in go scripts or here via fax. They will send again to [phone].

## 2023-01-06 DIAGNOSIS — G4733 Obstructive sleep apnea (adult) (pediatric): Secondary | ICD-10-CM | POA: Diagnosis not present

## 2023-01-18 NOTE — Progress Notes (Unsigned)
Assessment/Plan:   1.  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  Discussed that we usually don't add further meds for this, as it often gives patient's more side effects than it does good.  He is already using propranolol as needed.  However, if he would like to try a dopamine agonist, we can certainly trial this medication.  Spent a significant amount of time discussing risk, benefits, and side effects including but not limited to compulsive behaviors and sleep attacks.  They asked several questions and I answered those to the best of my ability.  They did not want to start it yet, but wanted to research it more and think about it and would let me know.  -He asked about surgical interventions.  We discussed these today and previous visit.    2.  Hx of cerebral abscess right occiput             - status post surgical intervention in 2007 with Dr. Vertell Limber             -unknown etiology, but no sequela   3.  Diabetic pn             -Discussed that this, along with Parkinson's, certainly can affect gait and balance.  Controlling diabetes, as well as exercise will be very important.   4.  Sleep apnea             -Patient just saw [facility] neurology early January.  Subjective:   Lawrence Johnson was seen today in follow up for Parkinsons disease.  My previous records were reviewed prior to todays visit as well as outside records available to me.  This patient is accompanied in the office by his spouse who supplements the history. Pt just saw [facility] neurology December 28, 2022.  Last visit, I changed the timing of his levodopa.  I recommended discontinuation of the propranolol, primarily because he already had bradycardia and low blood pressure.  He takes it if he has a meeting or at night if he is bothered by tremor.  This is about 1-2 days per week, and he is taking 2 pills each time.   He  feels that he is overall is doing well but he is tremoring more on the R.  He is walking a few miles every day and does some speed bag work.  He works on some of the PT therapies at home.  He has some cramping of the thumb area.    Current prescribed movement disorder medications: Carbidopa/levodopa 25/100, 2 tablets at 7 AM/1 tablet at 11 AM/1 tablet at 4 PM (just change timing last visit) - he is taking it at 1 at 6am/1 at 10am/1 at 2pm/1 at 5-7pm  PREVIOUS MEDICATIONS: Sinemet  ALLERGIES:   Allergies  Allergen Reactions   Dilantin [Phenytoin] Rash    Fever   Ezetimibe-Simvastatin Other (See Comments)    Myalgias    CURRENT MEDICATIONS:  Current Meds  Medication Sig   Alpha-Lipoic Acid 600 MG CAPS Take 2 capsules (1,200 mg total) by mouth daily.   aspirin 81 MG tablet Take 81 mg by mouth daily.   carbidopa-levodopa (SINEMET IR) 25-100 MG tablet 2 at 7am, 1 at 11 am, 1 at 4pm   cholecalciferol (VITAMIN D3) 25 MCG (1000 UNIT) tablet Take 1,000 Units by mouth daily.   cyanocobalamin (VITAMIN B12) 500 MCG tablet Take  500 mcg by mouth daily.   ezetimibe (ZETIA) 10 MG tablet TAKE 1 TABLET BY MOUTH EVERY DAY   fluticasone (FLONASE) 50 MCG/ACT nasal spray Place 1 spray into both nostrils daily.   JARDIANCE 10 MG TABS tablet Take 10 mg by mouth daily.   loratadine (CLARITIN) 10 MG tablet Take 10 mg by mouth daily as needed.   magnesium citrate SOLN Take 1 Bottle by mouth once.   metFORMIN (GLUMETZA) 500 MG (MOD) 24 hr tablet Take 500 mg by mouth daily with breakfast.   propranolol (INDERAL) 20 MG tablet Take 20 mg by mouth 2 (two) times daily.   rosuvastatin (CRESTOR) 20 MG tablet Take 10 mg by mouth as directed. TAKE 1/2 TABLET DAILY   testosterone (ANDROGEL) 50 MG/5GM (1%) GEL Place 5 g onto the skin daily.   valsartan (DIOVAN) 160 MG tablet Take 160 mg by mouth daily.     Objective:   PHYSICAL EXAMINATION:    VITALS:   Vitals:   01/20/23 0857  BP: 118/72  Pulse: 66  SpO2:  99%  Weight: 224 lb 3.2 oz (101.7 kg)  Height: '5\' 10"'$  (1.778 m)    GEN:  The patient appears stated age and is in NAD. HEENT:  Normocephalic, atraumatic.  The mucous membranes are moist. The superficial temporal arteries are without ropiness or tenderness. CV:  RRR Lungs:  CTAB Neck/HEME:  There are no carotid bruits bilaterally.  Neurological examination:  Orientation: The patient is alert and oriented x3.  Cranial nerves: There is good facial symmetry. Extraocular muscles are intact. The visual fields are full to confrontational testing. The speech is fluent and clear. Soft palate rises symmetrically and there is no tongue deviation. Hearing is intact to conversational tone. Sensation: Sensation is intact to light and pinprick throughout (facial, trunk, extremities). Vibration is intact at the bilateral big toe but it is decreased distally. There is no extinction with double simultaneous stimulation. There is no sensory dermatomal level identified. Motor: Strength is at least antigravity x 4    Movement examination: Tone: There is normal tone in the bilateral upper extremities.  The tone in the lower extremities is normal.  Abnormal movements: there is mild RUE rest tremor that increases with distraction.  There is mild RLE tremor.    this is similar to last visit.   Coordination:  There is mild decremation with toe taps bilaterally but otherwise no other decremation Gait and Station: The patient has no difficulty arising out of a deep-seated chair without the use of the hands. The patient's stride length is good.  The patient has a neg pull test.    this is same as last visit   Total time spent on today's visit was 40 minutes, including both face-to-face time and nonface-to-face time.  Time included that spent on review of records (prior notes available to me/labs/imaging if pertinent), discussing treatment and goals, answering patient's questions and coordinating care.  Cc:  [name], MD

## 2023-01-20 ENCOUNTER — Ambulatory Visit (INDEPENDENT_AMBULATORY_CARE_PROVIDER_SITE_OTHER): Payer: PPO | Admitting: Neurology

## 2023-01-20 ENCOUNTER — Encounter: Payer: Self-pay | Admitting: Neurology

## 2023-01-20 VITALS — BP 118/72 | HR 66 | Ht 70.0 in | Wt 224.2 lb

## 2023-01-20 DIAGNOSIS — G20A1 Parkinson's disease without dyskinesia, without mention of fluctuations: Secondary | ICD-10-CM | POA: Diagnosis not present

## 2023-01-20 DIAGNOSIS — G4733 Obstructive sleep apnea (adult) (pediatric): Secondary | ICD-10-CM

## 2023-01-20 NOTE — Patient Instructions (Signed)
Local and Online Resources for Power over Parkinson's Group  January 2024   LOCAL [place] PARKINSON'S GROUPS   Power over Parkinson's Group:    Power Over Parkinson's Patient Education Group will be Wednesday, January 10th-*Hybrid meting*- in person at [facility] location and via [facility], 2:00-3:00 pm.   Starting in November, Power over [employer] and Care Partner Groups will meet together, with plans for separate break out session for caregivers (*this will be evolving over the next few months) Upcoming Power over Parkinson's Meetings/Care Partner Support:  2nd Wednesdays of the month at 2 pm:   January 10th, February 14th [place] at [email or web address] if interested in participating in this group    [place]! Moves [employer] Instructor-Led Classes offering at [employer]!  TUESDAYS and Wednesdays 1-2 pm.   Contact Vonna Kotyk at  [email or web address]  or [phone] (Tuesday classes are modified for chair and standing only) Drumming for Parkinson's will be held on 2nd and 4th Mondays at 11:00 am.   Located at the [place] ([place].)  Contact Doylene Canning at [email or web address] or [place] Class, Mondays at 11 am.  Call [phone] for details Let's Try Pickleball-$25 for 6 weeks of Pickleball in [place].  Contact Corwin Levins for more details and for dates.  [email or web address] SAVE THE DATE and REGISTER:  [place] Chapter of Parkinson's Foundation:  Parkinson's Symposium.  Conversations about Parkinson's.  Saturday, February 19, 2023, 9:00 am-2:00 pm.  [place], *In person or online via [place]*.  Register at [email or web address] or call Beverlee Nims at [phone].   [place]:  [email or web address]  PD Health at Home continues:   Mindfulness Mondays, Wellness Wednesdays, Fitness Fridays   Upcoming Education:    Environmental health practitioner your Voice:  Air cabin crew. Wednesday, January 3rd,  1-2 pm  Managing Weight Loss & Retaining Muscle Mass.  Wednesday, Jan 10th, 1-2 pm Changes in Speech and Voice.  Wednesday, January 17th, 1-2 pm Register for virtual education and Patent attorney (webinars) at [email or web address].pl Please check out their website to sign up for emails and see their full online offerings      [place]:  [email or web address]   Third Thursday Webinars:  On the third Thursday of every month at 12 p.m. ET, join our free live webinars to learn about various aspects of living with Parkinson's disease and our work to speed medical breakthroughs.  Upcoming Webinar:  New Year, New Moves!  Explore Exercise for Life with Parkinson's.  Thursday, January 18th at 12 noon. Check out additional information on their website to see their full online offerings    [facility]:  [email or web address]  Upcoming Webinar:   Physical Therapy and Parkinson's.  Thursday, January 11th, 2 pm  Webinar Series:  Living with Parkinson's Meetup.   Third Thursdays each month, 3 pm  Care Partner Monthly Meetup.  With Robin Searing Phinney.  First Tuesday of each month, 2 pm  Check out additional information to Live Well Today on their website    Parkinson and Movement Disorders (PMD) Alliance:  www.[email or web address]  [employer] Online:  Online Education Events  Sign up for emails, which are sent weekly to give you updates on programming and online offerings    Parkinson's Association of the [place]:  www.[email or web address]  Information on online support groups, education events, and online exercises including Yoga, Parkinson's exercises  and more-LOTS of information on links to PD resources and online events  Virtual Support Group through  Parkinson's Association of the [place]; next one is scheduled for Wednesday, Feb 7th  MOVEMENT AND EXERCISE OPPORTUNITIES  PWR! Moves Classes at [place].  Wednesdays 10 and 11 am.   Contact Amy Marriott, PT [email or web address] if interested.  NEW PWR! Moves Class offerings at [employer].  *TUESDAYS* and Wednesdays 1-2 pm.    Contact Vonna Kotyk at  [email or web address]    Parkinson's Wellness Recovery (PWR! Moves)  www.[email or web address]  Info on the PWR! Virtual Experience:  You will have access to our expertise?through self-assessment, guided plans that start with the PD-specific fundamentals, educational content, tips, Q&A with an expert, and a growing Art therapist of PD-specific pre-recorded and live exercise classes of varying types and intensity - both physical and cognitive! If that is not enough, we offer 1:1 wellness consultations (in-person or virtual) to personalize your PWR! Research scientist (medical).   [place] Fridays:   As part of the PD Health @ Home program, this free video series focuses each week on one aspect of fitness designed to support people living with Parkinson's.? These weekly videos highlight the [place] fitness guidelines for people with Parkinson's disease.  ModemGamers.[email or web address]   Dance for PD website is offering free, live-stream classes throughout the week, as well as links to [employer] of classes:  [email or web address]  Virtual dance and Pilates for Parkinson's classes: Click on the Community Tab> Parkinson's Movement Initiative Tab.  To register for classes and for more information, visit [email or web address] and click the "community" tab.   [employer] Parkinson's Cycling Classes   [employer]:  Thursdays @ Noon-Live classes at [employer] ([employer] at [email or web address]?or [phone])  [employer]: Virtual  Classes Mondays and Thursdays Jeanette Caprice classes Tuesday, Wednesday and Thursday (contact [place] at [email or web address] ?or [phone])  [place]  Varied levels of classes are offered Tuesdays and Thursdays at [employer].   Stretching with Verdis Frederickson weekly class is also offered for people with Parkinson's  To observe a class or for more information, call [phone] or email Hezzie Bump at [email or web address]   ADDITIONAL SUPPORT AND RESOURCES  [employer]:Online Caregiver Education Opportunities:  www.well-[email or web address]/caregiver-education/caregiver-support-group.  You may also contact Vickki Muff at [email or web address] or [phone].     Well-Spring Navigator:  Just1Navigator program, a?free service to help individuals and families through the journey of determining care for older adults.  The "Navigator" is a [phone], Education officer, museum, who will speak with a prospective client and/or loved ones to provide an assessment of the situation and a set of recommendations for a personalized care plan -- all free of charge, and whether?Well[employer]Spring [employer] offers the needed service or not. If the need is not a service we provide, we are well-connected with reputable programs in town that we can refer you to.  www.well-springsolutions.org or to speak with the Navigator, call [phone].

## 2023-01-21 ENCOUNTER — Other Ambulatory Visit: Payer: Self-pay

## 2023-01-21 DIAGNOSIS — G4733 Obstructive sleep apnea (adult) (pediatric): Secondary | ICD-10-CM

## 2023-01-25 ENCOUNTER — Other Ambulatory Visit: Payer: Self-pay | Admitting: Neurology

## 2023-03-09 DIAGNOSIS — K121 Other forms of stomatitis: Secondary | ICD-10-CM | POA: Diagnosis not present

## 2023-03-11 ENCOUNTER — Other Ambulatory Visit: Payer: Self-pay

## 2023-03-11 ENCOUNTER — Emergency Department (HOSPITAL_BASED_OUTPATIENT_CLINIC_OR_DEPARTMENT_OTHER)
Admission: EM | Admit: 2023-03-11 | Discharge: 2023-03-11 | Disposition: A | Discharge status: Home / Self Care | Payer: PPO | Attending: Emergency Medicine | Admitting: Emergency Medicine

## 2023-03-11 ENCOUNTER — Encounter (HOSPITAL_BASED_OUTPATIENT_CLINIC_OR_DEPARTMENT_OTHER): Payer: Self-pay

## 2023-03-11 DIAGNOSIS — R000 Tachycardia, unspecified: Secondary | ICD-10-CM | POA: Diagnosis not present

## 2023-03-11 DIAGNOSIS — G20A1 Parkinson's disease without dyskinesia, without mention of fluctuations: Secondary | ICD-10-CM | POA: Insufficient documentation

## 2023-03-11 DIAGNOSIS — R509 Fever, unspecified: Secondary | ICD-10-CM | POA: Diagnosis present

## 2023-03-11 DIAGNOSIS — K120 Recurrent oral aphthae: Secondary | ICD-10-CM | POA: Diagnosis not present

## 2023-03-11 DIAGNOSIS — Z7984 Long term (current) use of oral hypoglycemic drugs: Secondary | ICD-10-CM | POA: Diagnosis not present

## 2023-03-11 DIAGNOSIS — Z20822 Contact with and (suspected) exposure to covid-19: Secondary | ICD-10-CM | POA: Diagnosis not present

## 2023-03-11 DIAGNOSIS — Z7982 Long term (current) use of aspirin: Secondary | ICD-10-CM | POA: Insufficient documentation

## 2023-03-11 DIAGNOSIS — E119 Type 2 diabetes mellitus without complications: Secondary | ICD-10-CM | POA: Insufficient documentation

## 2023-03-11 LAB — RESP PANEL BY RT-PCR (RSV, FLU A&B, COVID)  RVPGX2
Influenza A by PCR: NEGATIVE
Influenza B by PCR: NEGATIVE
Resp Syncytial Virus by PCR: NEGATIVE
SARS Coronavirus 2 by RT PCR: NEGATIVE

## 2023-03-11 NOTE — ED Triage Notes (Signed)
Pt to er, pt states that he is here for a wound on his L pointer finger, states that he is also here for some wounds in his mouth, states that he went to urgent care and got a script for valtrex, denies any relief.

## 2023-03-11 NOTE — ED Provider Notes (Signed)
[facility] Provider Note   CSN: [id number] Arrival date & time: 03/11/23  1508     History  Chief Complaint  Patient presents with   Insect Bite    Lawrence Johnson is a 72 y.o. male with diabetes and Parkinson's presented with redness on his left second digit, oral ulcers, fevers and chills for the last 4 days.  Patient states he woke up 1-2 of the symptoms and that his been taking ibuprofen and Tylenol every 4 hours to no relief.  Patient states he had a fever of 100.5 F today.  Patient has been seen by his primary care provider in given Valtrex to cover for possible herpes but states the ulcers are still present and that he is using salt water as a wash and that has not been helping.  Patient still been tolerating his own secretions and food and fluids orally.  Patient states his last A1c was 5.9% and that his sugars in the morning are around 100.  Patient denies chest pain, shortness of breath, abdominal pain, changes in sensation/motor skills, blood thinners  Home Medications Prior to Admission medications   Medication Sig Start Date End Date Taking? Authorizing Provider  Alpha-Lipoic Acid 600 MG CAPS Take 2 capsules (1,200 mg total) by mouth daily. 06/15/22   Dohmeier, Asencion Partridge, MD  aspirin 81 MG tablet Take 81 mg by mouth daily.    [provider]  carbidopa-levodopa (SINEMET IR) 25-100 MG tablet Take 1 at 6am/1 at 10am/1 at 2pm/1 at 5-7pm 01/25/23   Tat, Eustace Quail, DO  cholecalciferol (VITAMIN D3) 25 MCG (1000 UNIT) tablet Take 1,000 Units by mouth daily.    [provider]  cyanocobalamin (VITAMIN B12) 500 MCG tablet Take 500 mcg by mouth daily.    [provider]  ezetimibe (ZETIA) 10 MG tablet TAKE 1 TABLET BY MOUTH EVERY DAY 12/17/22   Minus Breeding, MD  fluticasone (FLONASE) 50 MCG/ACT nasal spray Place 1 spray into both nostrils daily. 12/19/14   [provider]  icosapent Ethyl (VASCEPA) 1 g  capsule  08/30/19   [provider]  JARDIANCE 10 MG TABS tablet Take 10 mg by mouth daily. 02/11/22   [provider]  loratadine (CLARITIN) 10 MG tablet Take 10 mg by mouth daily as needed.    [provider]  magnesium citrate SOLN Take 1 Bottle by mouth once.    [provider]  metFORMIN (GLUMETZA) 500 MG (MOD) 24 hr tablet Take 500 mg by mouth daily with breakfast.    [provider]  NON FORMULARY Pectasol    [provider]  NON FORMULARY Pectasol    [provider]  NON FORMULARY Glypho detox    [provider]  NON FORMULARY neuralli    [provider]  NON FORMULARY L-arginine    [provider]  NON FORMULARY L- Citrulline    [provider]  propranolol (INDERAL) 20 MG tablet Take 20 mg by mouth 2 (two) times daily. 02/04/22   [provider]  rosuvastatin (CRESTOR) 20 MG tablet Take 10 mg by mouth as directed. TAKE 1/2 TABLET DAILY    [provider]  testosterone (ANDROGEL) 50 MG/5GM (1%) GEL Place 5 g onto the skin daily.    [provider]  valsartan (DIOVAN) 160 MG tablet Take 160 mg by mouth daily.    [provider]      Allergies    Dilantin [phenytoin] and Ezetimibe-simvastatin  Review of Systems   Review of Systems  HENT:  Positive for mouth sores.   See HPI  Physical Exam Updated Vital Signs BP (!) 144/86 (BP Location: Right Arm)   Pulse 92   Temp 99.2 F (37.3 C) (Oral)   Resp 16   Ht 5\' 10"  (1.778 m)   Wt 99.8 kg   SpO2 97%   BMI 31.57 kg/m  Physical Exam Constitutional:      General: He is not in acute distress. HENT:     Mouth/Throat:     Lips: Lesions (Ulcer with surrounding erythema) present.     Mouth: Mucous membranes are moist.     Tongue: Lesions (ulcer with surrounding erythema) present.     Pharynx: Oropharynx is clear.     Tonsils: No tonsillar exudate.     Comments: Tolerating secretions Eyes:      Extraocular Movements: Extraocular movements intact.     Conjunctiva/sclera: Conjunctivae normal.     Pupils: Pupils are equal, round, and reactive to light.  Cardiovascular:     Rate and Rhythm: Regular rhythm. Tachycardia present.     Pulses: Normal pulses.     Heart sounds: Normal heart sounds.  Musculoskeletal:        General: Normal range of motion.     Cervical back: Normal range of motion.  Skin:    General: Skin is warm and dry.     Capillary Refill: Capillary refill takes less than 2 seconds.     Comments: Left second digit: Erythema on DIP with central dark red spot, joint is not warm, no area of fluctuance, no signs of bite marks, no vesicles  Neurological:     General: No focal deficit present.     Mental Status: He is alert and oriented to person, place, and time.     Comments: Right arm tremor rest Sensation intact in both hands  Psychiatric:        Mood and Affect: Mood normal.     ED Results / Procedures / Treatments   Labs (all labs ordered are listed, but only abnormal results are displayed) Labs Reviewed  RESP PANEL BY RT-PCR (RSV, FLU A&B, COVID)  [id number]    EKG None  Radiology No results found.  Procedures Procedures    Medications Ordered in ED Medications - No data to display  ED Course/ Medical Decision Making/ A&P                             Medical Decision Making  Lawrence Johnson 72 y.o. presented today for oral ulcers, fever/chills, erythema left second digit. Working DDx that I considered at this time includes, but not limited to, HSV, septic arthritis, abscess, cellulitis, URI, viral illness, aphthous ulcer, herpetic whitlow, paronychia.  R/o DDx: HSV, septic arthritis, abscess, cellulitis, URI, viral illness, aphthous ulcer, herpetic whitlow, paronychia: These are considered less likely due to history of present illness and physical exam findings  Review of prior external notes: 03/09/2023 progress note  Unique Tests and My  Interpretation:  Respiratory panel: Negative  Discussion with Independent Historian: Wife  Discussion of Management of Tests: None  Risk: Low:  - based on diagnostic testing/clinical impression and treatment plan  Risk Stratification Score: none  Staffed with Tegeler, MD  Plan: Patient presented for oral ulcers, fever/chills, erythema left second digit. On exam patient was in no distress however did have tremors from his Parkinson's but had stable vitals.  I suspect at this time patient has a viral illness causing his  aphthous ulcers and so a respiratory panel was ordered to further evaluate as patient is endorsing fevers and chills.  Patient is already on Valtrex.  The patient's finger does not appear septic or life-threatening at this time and is most likely from blood pooling from benign hit and will resolve with time.  Respiratory panel was negative.  Patient stable at this time.  Patient will be discharged and encouraged to continue taking his Valtrex and to monitor symptoms.  I encouraged patient to continue taking the Tylenol along with pushing fluids and using mouthwash.  I spoke with the patient about using Chloraseptic spray for symptom management along with Orajel.  I spoke with the patient that due to his diabetic state I would encourage him to take Tylenol over ibuprofen as I would be concerned about his stomach lining and kidneys.  At this time I do not believe the patient has any cellulitis and does not need antibiotics to cover for his finger as this is most likely a viral infection and he is already being treated for.  I encouraged patient to follow-up with his primary care provider in the next few days as symptoms could change however if symptoms worsen please return to ER.  Patient was given return precautions. Patient stable for discharge at this time.  Patient verbalized understanding of plan.         Final Clinical Impression(s) / ED Diagnoses Final diagnoses:   Aphthous ulcer of mouth    Rx / DC Orders ED Discharge Orders     None         Elvina Sidle 03/11/23 1706    Tegeler, Gwenyth Allegra, MD 03/11/23 1740

## 2023-03-11 NOTE — Discharge Instructions (Addendum)
Please continue taking your Valtrex as prescribed and taking in plenty of fluids.  Please use mouthwash to clean her mouth and you may use Chloraseptic spray for numbing along with Orajel.  Please take Tylenol instead of ibuprofen every 6 hours as needed for pain.  Please make an appointment with your primary care provider to be seen in the next few days and reevaluated.  If symptoms worsen please return to the ER.

## 2023-03-14 DIAGNOSIS — K121 Other forms of stomatitis: Secondary | ICD-10-CM | POA: Diagnosis not present

## 2023-03-14 DIAGNOSIS — L03012 Cellulitis of left finger: Secondary | ICD-10-CM | POA: Diagnosis not present

## 2023-03-14 DIAGNOSIS — I10 Essential (primary) hypertension: Secondary | ICD-10-CM | POA: Diagnosis not present

## 2023-03-14 DIAGNOSIS — G20A1 Parkinson's disease without dyskinesia, without mention of fluctuations: Secondary | ICD-10-CM | POA: Diagnosis not present

## 2023-03-14 DIAGNOSIS — E871 Hypo-osmolality and hyponatremia: Secondary | ICD-10-CM | POA: Diagnosis not present

## 2023-03-14 DIAGNOSIS — E1165 Type 2 diabetes mellitus with hyperglycemia: Secondary | ICD-10-CM | POA: Diagnosis not present

## 2023-03-14 DIAGNOSIS — D72819 Decreased white blood cell count, unspecified: Secondary | ICD-10-CM | POA: Diagnosis not present

## 2023-03-14 DIAGNOSIS — R509 Fever, unspecified: Secondary | ICD-10-CM | POA: Diagnosis not present

## 2023-03-21 DIAGNOSIS — E1165 Type 2 diabetes mellitus with hyperglycemia: Secondary | ICD-10-CM | POA: Diagnosis not present

## 2023-03-28 ENCOUNTER — Emergency Department (HOSPITAL_COMMUNITY): Payer: PPO

## 2023-03-28 ENCOUNTER — Emergency Department (HOSPITAL_COMMUNITY)
Admission: EM | Admit: 2023-03-28 | Discharge: 2023-03-28 | Disposition: A | Discharge status: Home / Self Care | Payer: PPO | Attending: Emergency Medicine | Admitting: Emergency Medicine

## 2023-03-28 DIAGNOSIS — H532 Diplopia: Secondary | ICD-10-CM | POA: Insufficient documentation

## 2023-03-28 DIAGNOSIS — R001 Bradycardia, unspecified: Secondary | ICD-10-CM | POA: Diagnosis not present

## 2023-03-28 DIAGNOSIS — Z7982 Long term (current) use of aspirin: Secondary | ICD-10-CM | POA: Diagnosis not present

## 2023-03-28 DIAGNOSIS — F40240 Claustrophobia: Secondary | ICD-10-CM | POA: Insufficient documentation

## 2023-03-28 DIAGNOSIS — G20C Parkinsonism, unspecified: Secondary | ICD-10-CM | POA: Diagnosis not present

## 2023-03-28 DIAGNOSIS — G9389 Other specified disorders of brain: Secondary | ICD-10-CM | POA: Diagnosis not present

## 2023-03-28 LAB — COMPREHENSIVE METABOLIC PANEL
ALT: 21 U/L (ref 0–44)
AST: 32 U/L (ref 15–41)
Albumin: 4 g/dL (ref 3.5–5.0)
Alkaline Phosphatase: 38 U/L (ref 38–126)
Anion gap: 11 (ref 5–15)
BUN: 15 mg/dL (ref 8–23)
CO2: 26 mmol/L (ref 22–32)
Calcium: 9.2 mg/dL (ref 8.9–10.3)
Chloride: 101 mmol/L (ref 98–111)
Creatinine, Ser: 0.83 mg/dL (ref 0.61–1.24)
GFR, Estimated: >60 mL/min (ref >60)
Glucose, Bld: 110 mg/dL — ABNORMAL HIGH (ref 70–99)
Potassium: 4.6 mmol/L (ref 3.5–5.1)
Sodium: 138 mmol/L (ref 135–145)
Total Bilirubin: 1 mg/dL (ref 0.3–1.2)
Total Protein: 7 g/dL (ref 6.5–8.1)

## 2023-03-28 LAB — DIFFERENTIAL
Abs Immature Granulocytes: 0.02 10*3/uL (ref 0.00–0.07)
Basophils Absolute: 0 10*3/uL (ref 0.0–0.1)
Basophils Relative: 1 %
Eosinophils Absolute: 0.1 10*3/uL (ref 0.0–0.5)
Eosinophils Relative: 2 %
Immature Granulocytes: 0 %
Lymphocytes Relative: 16 %
Lymphs Abs: 1.1 10*3/uL (ref 0.7–4.0)
Monocytes Absolute: 0.4 10*3/uL (ref 0.1–1.0)
Monocytes Relative: 6 %
Neutro Abs: 5.3 10*3/uL (ref 1.7–7.7)
Neutrophils Relative %: 75 %

## 2023-03-28 LAB — CBC
HCT: 47.2 % (ref 39.0–52.0)
Hemoglobin: 15.9 g/dL (ref 13.0–17.0)
MCH: 30.1 pg (ref 26.0–34.0)
MCHC: 33.7 g/dL (ref 30.0–36.0)
MCV: 89.4 fL (ref 80.0–100.0)
Platelets: 210 10*3/uL (ref 150–400)
RBC: 5.28 MIL/uL (ref 4.22–5.81)
RDW: 14 % (ref 11.5–15.5)
WBC: 7 10*3/uL (ref 4.0–10.5)
nRBC: 0 % (ref 0.0–0.2)

## 2023-03-28 LAB — CBG MONITORING, ED
Glucose-Capillary: 120 mg/dL — ABNORMAL HIGH (ref 70–99)
Glucose-Capillary: 99 mg/dL (ref 70–99)

## 2023-03-28 LAB — I-STAT CHEM 8, ED
BUN: 18 mg/dL (ref 8–23)
Calcium, Ion: 1.13 mmol/L — ABNORMAL LOW (ref 1.15–1.40)
Chloride: 103 mmol/L (ref 98–111)
Creatinine, Ser: 0.9 mg/dL (ref 0.61–1.24)
Glucose, Bld: 112 mg/dL — ABNORMAL HIGH (ref 70–99)
HCT: 47 % (ref 39.0–52.0)
Hemoglobin: 16 g/dL (ref 13.0–17.0)
Potassium: 4.6 mmol/L (ref 3.5–5.1)
Sodium: 138 mmol/L (ref 135–145)
TCO2: 28 mmol/L (ref 22–32)

## 2023-03-28 LAB — ETHANOL: Alcohol, Ethyl (B): <10 mg/dL (ref <10)

## 2023-03-28 MED ORDER — LORAZEPAM 1 MG PO TABS
2.0000 mg | ORAL_TABLET | Freq: Once | ORAL | Status: AC
  Administered 2023-03-28: 2 mg via ORAL
  Filled 2023-03-28: qty 2

## 2023-03-28 MED ORDER — LORAZEPAM 2 MG/ML IJ SOLN
1.0000 mg | Freq: Once | INTRAMUSCULAR | Status: AC
  Administered 2023-03-28: 1 mg via INTRAVENOUS
  Filled 2023-03-28: qty 1

## 2023-03-28 MED ORDER — SODIUM CHLORIDE 0.9% FLUSH
3.0000 mL | Freq: Once | INTRAVENOUS | Status: AC
  Administered 2023-03-28: 3 mL via INTRAVENOUS

## 2023-03-28 NOTE — ED Triage Notes (Signed)
Patient here for evaluation of diplopia that started on Saturday. Seen at PCP and referred to ED for further evaluation. No dysarthria, no facial droop, no arm drift, patient is alert, oriented, ambulating independently with steady gait.

## 2023-03-28 NOTE — ED Provider Notes (Signed)
[place] [facility]GENC[facility] DEPARTMENT AT [facility] Provider Note   CSN: [id number] Arrival date & time: 03/28/23  1407     History  Chief Complaint  Patient presents with   Diplopia    Lawrence Johnson is a 72 y.o. male.  Patient presents with double vision that started before the weekend.  Patient seen by primary doctor and eye doctor and referred to ER for further evaluation and consideration of MRI.  No difficulty speaking or with speech, no facial droop, no changes in gait.  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  Patient on carbidopa levodopa.  No fevers or chills.  No concerning headaches.  Double vision is binocular.  No history of TIA or stroke.  No blood thinner use.       Home Medications Prior to Admission medications   Medication Sig Start Date End Date Taking? Authorizing Provider  Alpha-Lipoic Acid 600 MG CAPS Take 2 capsules (1,200 mg total) by mouth daily. 06/15/22   Dohmeier, Porfirio Mylar, MD  aspirin 81 MG tablet Take 81 mg by mouth daily.    [provider]  carbidopa-levodopa (SINEMET IR) 25-100 MG tablet Take 1 at 6am/1 at 10am/1 at 2pm/1 at 5-7pm 01/25/23   Tat, Octaviano Batty, DO  cholecalciferol (VITAMIN D3) 25 MCG (1000 UNIT) tablet Take 1,000 Units by mouth daily.    [provider]  cyanocobalamin (VITAMIN B12) 500 MCG tablet Take 500 mcg by mouth daily.    [provider]  ezetimibe (ZETIA) 10 MG tablet TAKE 1 TABLET BY MOUTH EVERY DAY 12/17/22   Rollene Rotunda, MD  fluticasone (FLONASE) 50 MCG/ACT nasal spray Place 1 spray into both nostrils daily. 12/19/14   [provider]  icosapent Ethyl (VASCEPA) 1 g capsule  08/30/19   [provider]  JARDIANCE 10 MG TABS tablet Take 10 mg by mouth daily. 02/11/22   [provider]  loratadine (CLARITIN) 10 MG tablet Take 10 mg by mouth daily as needed.    [provider]  magnesium citrate SOLN Take 1  Bottle by mouth once.    [provider]  metFORMIN (GLUMETZA) 500 MG (MOD) 24 hr tablet Take 500 mg by mouth daily with breakfast.    [provider]  NON FORMULARY Pectasol    [provider]  NON FORMULARY Pectasol    [provider]  NON FORMULARY Glypho detox    [provider]  NON FORMULARY neuralli    [provider]  NON FORMULARY L-arginine    [provider]  NON FORMULARY L- Citrulline    [provider]  propranolol (INDERAL) 20 MG tablet Take 20 mg by mouth 2 (two) times daily. 02/04/22   [provider]  rosuvastatin (CRESTOR) 20 MG tablet Take 10 mg by mouth as directed. TAKE 1/2 TABLET DAILY    [provider]  testosterone (ANDROGEL) 50 MG/5GM (1%) GEL Place 5 g onto the skin daily.    [provider]  valsartan (DIOVAN) 160 MG tablet Take 160 mg by mouth daily.    [provider]      Allergies    Dilantin [phenytoin] and Ezetimibe-simvastatin    Review of Systems   Review of Systems  Constitutional:  Negative for chills and fever.  HENT:  Negative for congestion.   Eyes:  Positive for visual disturbance.  Respiratory:  Negative for shortness of breath.   Cardiovascular:  Negative for  chest pain.  Gastrointestinal:  Negative for abdominal pain and vomiting.  Genitourinary:  Negative for dysuria and flank pain.  Musculoskeletal:  Negative for back pain, neck pain and neck stiffness.  Skin:  Negative for rash.  Neurological:  Negative for light-headedness and headaches.    Physical Exam Updated Vital Signs BP 131/69 Comment: Simultaneous filing. User may not have seen previous data.  Pulse (!) 57   Temp 97.8 F (36.6 C)   Resp 15 Comment: Simultaneous filing. User may not have seen previous data.  SpO2 95% Comment: Simultaneous filing. User may not have seen previous data. Physical Exam Vitals and nursing note reviewed.  Constitutional:      General: He  is not in acute distress.    Appearance: He is well-developed.  HENT:     Head: Normocephalic and atraumatic.     Mouth/Throat:     Mouth: Mucous membranes are moist.  Eyes:     General:        Right eye: No discharge.        Left eye: No discharge.     Conjunctiva/sclera: Conjunctivae normal.  Neck:     Trachea: No tracheal deviation.  Cardiovascular:     Rate and Rhythm: Normal rate and regular rhythm.     Heart sounds: No murmur heard. Pulmonary:     Effort: Pulmonary effort is normal.     Breath sounds: Normal breath sounds.  Abdominal:     General: There is no distension.     Palpations: Abdomen is soft.     Tenderness: There is no abdominal tenderness. There is no guarding.  Musculoskeletal:        General: No swelling or tenderness.     Cervical back: Normal range of motion and neck supple. No rigidity.  Skin:    General: Skin is warm.     Capillary Refill: Capillary refill takes less than 2 seconds.     Findings: No rash.  Neurological:     General: No focal deficit present.     Mental Status: He is alert.     Comments: Patient has double vision in all quadrants with binocular testing, visual fields intact with unilocular testing.  Equal strength bilateral.  Mild tremor right arm chronic.  Finger-nose intact however more difficulty with left side.  Sensation intact in all extremities to palpation.  Psychiatric:        Mood and Affect: Mood normal.     ED Results / Procedures / Treatments   Labs (all labs ordered are listed, but only abnormal results are displayed) Labs Reviewed  COMPREHENSIVE METABOLIC PANEL - Abnormal; Notable for the following components:      Result Value   Glucose, Bld 110 (*)    All other components within normal limits  I-STAT CHEM 8, ED - Abnormal; Notable for the following components:   Glucose, Bld 112 (*)    Calcium, Ion 1.13 (*)    All other components within normal limits  CBG MONITORING, ED - Abnormal; Notable for the following  components:   Glucose-Capillary 120 (*)    All other components within normal limits  CBC  DIFFERENTIAL  ETHANOL  PROTIME-INR  APTT  CBG MONITORING, ED    EKG None  Radiology MR BRAIN WO CONTRAST  Result Date: 03/28/2023 CLINICAL DATA:  Diplopia EXAM: MRI HEAD WITHOUT CONTRAST MRA HEAD WITHOUT CONTRAST TECHNIQUE: Multiplanar, multi-echo pulse sequences of the brain and surrounding structures were acquired without intravenous contrast. Angiographic images of the Circle  of Willis were acquired using MRA technique without intravenous contrast. COMPARISON:  Same day CT head FINDINGS: MR HEAD FINDINGS Brain: Negative for an acute infarct. No hydrocephalus. No extra-axial fluid collection. Encephalomalacia in the right occipital lobe adjacent to craniotomy site. No hemorrhage. Vascular: Normal flow voids.  See below Skull and upper cervical spine: Right occipital craniotomy. Sinuses/Orbits: No mastoid or middle ear effusion. Paranasal sinuses are clear. Bilateral lens replacement. Orbits are otherwise unremarkable. Other: Known MRA HEAD FINDINGS Anterior circulation: No aneurysm. No occlusion. Normal flow signal. Posterior circulation: No aneurysm. No occlusion. Normal flow signal. Anatomic variants:None IMPRESSION: 1. No acute intracranial abnormality. 2. Normal MRA of the head. Electronically Signed   By: Lorenza Cambridge M.D.   On: 03/28/2023 20:33   MR ANGIO HEAD WO CONTRAST  Result Date: 03/28/2023 CLINICAL DATA:  Diplopia EXAM: MRI HEAD WITHOUT CONTRAST MRA HEAD WITHOUT CONTRAST TECHNIQUE: Multiplanar, multi-echo pulse sequences of the brain and surrounding structures were acquired without intravenous contrast. Angiographic images of the Circle of Willis were acquired using MRA technique without intravenous contrast. COMPARISON:  Same day CT head FINDINGS: MR HEAD FINDINGS Brain: Negative for an acute infarct. No hydrocephalus. No extra-axial fluid collection. Encephalomalacia in the right occipital  lobe adjacent to craniotomy site. No hemorrhage. Vascular: Normal flow voids.  See below Skull and upper cervical spine: Right occipital craniotomy. Sinuses/Orbits: No mastoid or middle ear effusion. Paranasal sinuses are clear. Bilateral lens replacement. Orbits are otherwise unremarkable. Other: Known MRA HEAD FINDINGS Anterior circulation: No aneurysm. No occlusion. Normal flow signal. Posterior circulation: No aneurysm. No occlusion. Normal flow signal. Anatomic variants:None IMPRESSION: 1. No acute intracranial abnormality. 2. Normal MRA of the head. Electronically Signed   By: Lorenza Cambridge M.D.   On: 03/28/2023 20:33   CT HEAD WO CONTRAST  Result Date: 03/28/2023 CLINICAL DATA:  Acute stroke symptoms EXAM: CT HEAD WITHOUT CONTRAST TECHNIQUE: Contiguous axial images were obtained from the base of the skull through the vertex without intravenous contrast. RADIATION DOSE REDUCTION: This exam was performed according to the departmental dose-optimization program which includes automated exposure control, adjustment of the mA and/or kV according to patient size and/or use of iterative reconstruction technique. COMPARISON:  07/20/2019 FINDINGS: Brain: No acute intracranial hemorrhage, new mass lesion, acute infarction, midline shift, herniation, hydrocephalus or extra-axial collection. No focal mass or edema. Posterior right occipital encephalomalacia with an overlying craniotomy compatible with sequelae from prior surgery. Cisterns are patent. No cerebellar abnormality. Vascular: No hyperdense vessel or unexpected calcification. Skull: Remote right occipital craniotomy. Mastoids are clear. No acute osseous finding. Sinuses/Orbits: Orbits are symmetric. Minor maxillary sinus disease. Other: None. IMPRESSION: 1. No acute intracranial abnormality by noncontrast CT. 2. 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 Electronically Signed   By: Judie Petit.  Shick M.D.   On: 03/28/2023 15:15     Procedures Procedures    Medications Ordered in ED Medications  sodium chloride flush (NS) 0.9 % injection 3 mL (3 mLs Intravenous Given 03/28/23 1559)  LORazepam (ATIVAN) tablet 2 mg (2 mg Oral Given 03/28/23 1714)  LORazepam (ATIVAN) injection 1 mg (1 mg Intravenous Given 03/28/23 1805)    ED Course/ Medical Decision Making/ A&P                             Medical Decision Making Amount and/or Complexity of Data Reviewed Labs: ordered. Radiology: ordered.  Risk Prescription drug management.   Patient presents with binocular diplopia, blood  work reviewed independently no signs significant anemia or electrolyte imbalance.  CT scan of the head reviewed independently showing old craniotomy area and encephalomalacia but no acute stroke or bleed seen.  Plan for MRI, discussed with neurology for consult.  Patient comfortable this plan.  Blood work reviewed independently reassuring normal electrolytes, hemoglobin white blood cell count unremarkable.  Patient required Ativan for claustrophobia. MRI results independently reviewed with patient and no acute abnormalities.  Discussed having neurology discussed results in the ER and neck steps however patient has a neurologist he prefers to follow-up with his comfortable discharge.  Patient has a wife with him in the room.  Patient stable for discharge.       Final Clinical Impression(s) / ED Diagnoses Final diagnoses:  Binocular vision disorder with diplopia    Rx / DC Orders ED Discharge Orders     None         Blane OharaZavitz, Noriel Guthrie, MD 03/28/23 2047

## 2023-03-28 NOTE — Discharge Instructions (Addendum)
Follow-up closely with your neurologist and ophthalmologist.  Return for new concerns.  MR ANGIO HEAD WO CONTRAST (Final result) Result time 03/28/23 20:33:29 Final result by Murray Hodgkins, MD (03/28/23 20:33:29)           Narrative:   CLINICAL DATA:  Diplopia  EXAM: MRI HEAD WITHOUT CONTRAST  MRA HEAD WITHOUT CONTRAST  TECHNIQUE: Multiplanar, multi-echo pulse sequences of the brain and surrounding structures were acquired without intravenous contrast. Angiographic images of the Circle of Willis were acquired using MRA technique without intravenous contrast.  COMPARISON:  Same day CT head  FINDINGS: MR HEAD FINDINGS  Brain: Negative for an acute infarct. No hydrocephalus. No extra-axial fluid collection. Encephalomalacia in the right occipital lobe adjacent to craniotomy site. No hemorrhage.  Vascular: Normal flow voids.  See below  Skull and upper cervical spine: Right occipital craniotomy.  Sinuses/Orbits: No mastoid or middle ear effusion. Paranasal sinuses are clear. Bilateral lens replacement. Orbits are otherwise unremarkable.  Other: Known  MRA HEAD FINDINGS  Anterior circulation: No aneurysm. No occlusion. Normal flow signal.  Posterior circulation: No aneurysm. No occlusion. Normal flow signal.  Anatomic variants:None  IMPRESSION: 1. No acute intracranial abnormality. 2. Normal MRA of the head.   Electronically Signed   By: Lorenza Cambridge M.D.   On: 03/28/2023 20:33               MR BRAIN WO CONTRAST (Final result) Result time 03/28/23 20:33:29 Final result by Murray Hodgkins, MD (03/28/23 20:33:29)           Narrative:   CLINICAL DATA:  Diplopia  EXAM: MRI HEAD WITHOUT CONTRAST  MRA HEAD WITHOUT CONTRAST  TECHNIQUE: Multiplanar, multi-echo pulse sequences of the brain and surrounding structures were acquired without intravenous contrast. Angiographic images of the Circle of Willis were acquired using MRA technique without  intravenous contrast.  COMPARISON:  Same day CT head  FINDINGS: MR HEAD FINDINGS  Brain: Negative for an acute infarct. No hydrocephalus. No extra-axial fluid collection. Encephalomalacia in the right occipital lobe adjacent to craniotomy site. No hemorrhage.  Vascular: Normal flow voids.  See below  Skull and upper cervical spine: Right occipital craniotomy.  Sinuses/Orbits: No mastoid or middle ear effusion. Paranasal sinuses are clear. Bilateral lens replacement. Orbits are otherwise unremarkable.  Other: Known  MRA HEAD FINDINGS  Anterior circulation: No aneurysm. No occlusion. Normal flow signal.  Posterior circulation: No aneurysm. No occlusion. Normal flow signal.  Anatomic variants:None  IMPRESSION: 1. No acute intracranial abnormality. 2. Normal MRA of the head.

## 2023-03-28 NOTE — ED Notes (Signed)
Pt transported to MRI 

## 2023-03-29 ENCOUNTER — Telehealth: Payer: Self-pay

## 2023-03-29 ENCOUNTER — Other Ambulatory Visit: Payer: Self-pay

## 2023-03-29 DIAGNOSIS — F519 Sleep disorder not due to a substance or known physiological condition, unspecified: Secondary | ICD-10-CM

## 2023-03-29 DIAGNOSIS — G4733 Obstructive sleep apnea (adult) (pediatric): Secondary | ICD-10-CM

## 2023-03-29 NOTE — Telephone Encounter (Signed)
Patient states he had double vision over the weekend, followed up with PCP who recommended he get go to the ED to have an MRI done. After imaging was completed the neurologist recommended he follow up with Dr.Tat, patient has an appointment in August and is wondering what he can do in the meantime.

## 2023-03-29 NOTE — Telephone Encounter (Signed)
Spoke to patient he asked me to resend the referral to pulm to get scheduled and stated its been a hard time for him but he is cancelling the appointment with [facility] and hoping you can get him in sooner to see his double vision he is wearing an eye patch now

## 2023-03-31 ENCOUNTER — Telehealth: Payer: Self-pay

## 2023-03-31 ENCOUNTER — Ambulatory Visit: Payer: PPO | Attending: Neurology | Admitting: Physical Therapy

## 2023-03-31 DIAGNOSIS — H524 Presbyopia: Secondary | ICD-10-CM | POA: Diagnosis not present

## 2023-03-31 DIAGNOSIS — H532 Diplopia: Secondary | ICD-10-CM | POA: Diagnosis not present

## 2023-03-31 DIAGNOSIS — R2689 Other abnormalities of gait and mobility: Secondary | ICD-10-CM | POA: Insufficient documentation

## 2023-03-31 NOTE — Telephone Encounter (Signed)
     Patient  visit on 4/8  at [facility]  Have you been able to follow up with your primary care physician? Yes   The patient was or was not able to obtain any needed medicine or equipment. Yes   Are there diet recommendations that you are having difficulty following? Na  Patient expresses understanding of discharge instructions and education provided has no other needs at this time. Yes       Netha Dafoe [facility], [place] [phone] [address], [place],  [address] Phone: [phone] Email: [email or web address]    

## 2023-03-31 NOTE — Therapy (Signed)
[facility] [place] [facility] [address], [address] [place], [place], [address] Phone: [phone]   Fax:  [phone]  Patient Details  Name: Lawrence Johnson MRN: [id number] Date of Birth: 04-Nov-1951 Referring Provider:  Creola Corn, MD  Encounter Date: 03/31/2023  Physical Therapy Parkinson's Disease Screen   Timed Up and Go test: 9.88 sec  (compared to 9.53)  10 meter walk test: 7.15 sec = 4.58 ft/sec (compared to 4.77 ft/sec)  5 time sit to stand test:11.97 sec (compared to 9.07 sec)  Patient does not require Physical Therapy services at this time.  Recommend Physical Therapy screen in 6-9 months.   Recent ED visit with double vision-has eye patch and is to see eye doctor today.  No falls.  Is consistently doing HEP. Advised that if he needs further PT due to changes in vision/balance, to contact MD.  Gean Maidens., PT 03/31/2023, 11:52 AM  [facility] [address], [address] [place], [place], [address] Phone: [phone]   Fax:  [phone]

## 2023-04-07 ENCOUNTER — Telehealth: Payer: Self-pay | Admitting: Neurology

## 2023-04-07 NOTE — Telephone Encounter (Signed)
Received a note today from Dr. Charlotte Sanes from [facility] ophthalmology.  It was dated March 31, 2023, but I just received it today.  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

## 2023-04-08 DIAGNOSIS — G4733 Obstructive sleep apnea (adult) (pediatric): Secondary | ICD-10-CM | POA: Diagnosis not present

## 2023-04-15 ENCOUNTER — Ambulatory Visit: Payer: PPO | Admitting: Nurse Practitioner

## 2023-04-15 ENCOUNTER — Encounter: Payer: Self-pay | Admitting: Nurse Practitioner

## 2023-04-15 VITALS — BP 124/78 | HR 63 | Temp 97.9°F | Ht 70.0 in | Wt 220.0 lb

## 2023-04-15 DIAGNOSIS — G4733 Obstructive sleep apnea (adult) (pediatric): Secondary | ICD-10-CM | POA: Diagnosis not present

## 2023-04-15 DIAGNOSIS — R351 Nocturia: Secondary | ICD-10-CM | POA: Diagnosis not present

## 2023-04-15 DIAGNOSIS — E669 Obesity, unspecified: Secondary | ICD-10-CM | POA: Diagnosis not present

## 2023-04-15 DIAGNOSIS — E66811 Obesity, class 1: Secondary | ICD-10-CM | POA: Insufficient documentation

## 2023-04-15 NOTE — Assessment & Plan Note (Signed)
Likely related to Parkinson's and causing disruptions in his sleep. He is working with neurology on this.

## 2023-04-15 NOTE — Progress Notes (Unsigned)
@Patient  ID: Lawrence Johnson, male    DOB: July 12, 1951, 72 y.o.   MRN: [id number]  Chief Complaint  Patient presents with   sleep consult    Pt is here to get established care for his cpap machine. Pt states the cpap machine is working well for him.    Referring provider: Tat, Octaviano Batty, DO  HPI: 72 year old male, never smoker referred for sleep consult.  Past medical history significant for OSA on CPAP, diabetes, hypertension, Parkinson's, HLD.  TEST/EVENTS:  09/22/2020 HST: AHI 17.2/h, REM AHI 37.2/h  04/15/2023: Today-sleep consult Patient presents today for sleep consult and to establish care for management of sleep apnea.  He was referred by Dr. Arbutus Leas with neurology.  He originally started having problems with his sleep around 20 years ago.  He had a sleep study which she does not render the exact results of.  He ended up having his tonsils removed and uvula surgery.  His symptoms improved.  Then a few years ago, started noticing that he was not resting as well and was snoring more.  He had a repeat home sleep study which revealed moderate obstructive sleep apnea and was restarted on CPAP therapy.  He has been doing well on this.  Feels like he wakes feeling better rested but does still have some fatigue during the day.  He attributes this to his Parkinson's.  He sleeps better at night but is still waking up frequently to urinate.  Working with neurology on this.  No significant leaks or difficulties with his machine.  Wears a fullface mask.  Denies any drowsy driving, morning headaches, sleep parasomnia/paralysis. He goes to bed around 10 PM.  Falls asleep within 10 minutes.  Up around 6 times a night to urinate.  Gets up at 6 AM.  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  He is 30 pounds down over the last couple of years.  Last sleep study was in 2021.  He does not know his exact CPAP settings.  Uses app to care for his DME company. He has a history of high blood pressure and diabetes controlled  on current medication regimen.  No history of stroke or cardiac arrhythmias. He is a never smoker.  Drinks 3 beers a week.  No excessive caffeine intake.  Lives with his wife.  Works as an Art gallery manager.  Family history of heart disease.  Epworth 6  Allergies  Allergen Reactions   Dilantin [Phenytoin] Rash    Fever   Ezetimibe-Simvastatin Other (See Comments)    Myalgias    Immunization History  Administered Date(s) Administered   Influenza, High Dose Seasonal PF 08/20/2022   Influenza,inj,Quad PF,6+ Mos 09/16/2018   Zoster Recombinat (Shingrix) 05/22/2018, 08/20/2018    Past Medical History:  Diagnosis Date   Angiodysplasia of colon 05/23/2020   2 mm ascending   Arthritis    Asthma    Blood transfusion without reported diagnosis    Brain abscess    Diabetes mellitus without complication (HCC)    Elevated LFTs    Fatty liver    GERD (gastroesophageal reflux disease)    Hx of colonic polyp - adenoma 07/29/2009   Hyperglycemia    Hyperlipidemia    Hypertension    Obesity     Tobacco History: Social History   Tobacco Use  Smoking Status Never  Smokeless Tobacco Never   Counseling given: Not Answered   Outpatient Medications Prior to Visit  Medication Sig Dispense Refill   Alpha-Lipoic Acid 600 MG  CAPS Take 2 capsules (1,200 mg total) by mouth daily. 60 capsule 1   aspirin 81 MG tablet Take 81 mg by mouth daily.     carbidopa-levodopa (SINEMET IR) 25-100 MG tablet Take 1 at 6am/1 at 10am/1 at 2pm/1 at 5-7pm 360 tablet 1   cholecalciferol (VITAMIN D3) 25 MCG (1000 UNIT) tablet Take 1,000 Units by mouth daily.     cyanocobalamin (VITAMIN B12) 500 MCG tablet Take 500 mcg by mouth daily.     ezetimibe (ZETIA) 10 MG tablet TAKE 1 TABLET BY MOUTH EVERY DAY 90 tablet 3   fluticasone (FLONASE) 50 MCG/ACT nasal spray Place 1 spray into both nostrils daily.     icosapent Ethyl (VASCEPA) 1 g capsule      JARDIANCE 10 MG TABS tablet Take 10 mg by mouth daily.     loratadine  (CLARITIN) 10 MG tablet Take 10 mg by mouth daily as needed.     magnesium citrate SOLN Take 1 Bottle by mouth once.     metFORMIN (GLUMETZA) 500 MG (MOD) 24 hr tablet Take 500 mg by mouth daily with breakfast.     NON FORMULARY Pectasol     NON FORMULARY Pectasol     NON FORMULARY Glypho detox     NON FORMULARY neuralli     NON FORMULARY L-arginine     NON FORMULARY L- Citrulline     propranolol (INDERAL) 20 MG tablet Take 20 mg by mouth 2 (two) times daily.     rosuvastatin (CRESTOR) 20 MG tablet Take 10 mg by mouth as directed. TAKE 1/2 TABLET DAILY     testosterone (ANDROGEL) 50 MG/5GM (1%) GEL Place 5 g onto the skin daily.     valsartan (DIOVAN) 160 MG tablet Take 160 mg by mouth daily.     No facility-administered medications prior to visit.     Review of Systems:   Constitutional: No weight loss or gain, night sweats, fevers, chills, or lassitude. +daytime fatigue  HEENT: No headaches, difficulty swallowing, tooth/dental problems, or sore throat. No sneezing, itching, ear ache, nasal congestion, or post nasal drip CV:  No chest pain, orthopnea, PND, swelling in lower extremities, anasarca, dizziness, palpitations, syncope Resp: No shortness of breath with exertion or at rest. No excess mucus or change in color of mucus. No productive or non-productive. No hemoptysis. No wheezing.  No chest wall deformity GI:  No heartburn, indigestion, abdominal pain, nausea, vomiting, diarrhea, change in bowel habits, loss of appetite, bloody stools.  GU: No dysuria, change in color of urine, urgency. +nocturia Skin: No rash, lesions, ulcerations MSK:  No joint pain or swelling.   Neuro: No dizziness or lightheadedness. +tremor  Psych: No depression or anxiety. Mood stable. +sleep disturbance     Physical Exam:  BP 124/78 (BP Location: Right Arm, Patient Position: Sitting, Cuff Size: Normal)   Pulse 63   Temp 97.9 F (36.6 C) (Oral)   Ht 5\' 10"  (1.778 m)   Wt 220 lb (99.8 kg)   SpO2  98% Comment: RA  BMI 31.57 kg/m   GEN: Pleasant, interactive, well-appearing; in no acute distress. HEENT:  Normocephalic and atraumatic. PERRLA. Sclera white. Nasal turbinates pink, moist and patent bilaterally. No rhinorrhea present. Oropharynx pink and moist, without exudate or edema. No lesions, ulcerations, or postnasal drip. Mallampati II NECK:  Supple w/ fair ROM. No JVD present. Normal carotid impulses w/o bruits. Thyroid symmetrical with no goiter or nodules palpated. No lymphadenopathy.   CV: RRR, no m/r/g, no peripheral edema.  Pulses intact, +2 bilaterally. No cyanosis, pallor or clubbing. PULMONARY:  Unlabored, regular breathing. Clear bilaterally A&P w/o wheezes/rales/rhonchi. No accessory muscle use.  GI: BS present and normoactive. Soft, non-tender to palpation. No organomegaly or masses detected.  MSK: No erythema, warmth or tenderness. Cap refil <2 sec all extrem. No deformities or joint swelling noted.  Neuro: A/Ox3. No focal deficits noted.   Skin: Warm, no lesions or rashe Psych: Normal affect and behavior. Judgement and thought content appropriate.     Lab Results:  CBC    Component Value Date/Time   WBC 7.0 03/28/2023 1429   RBC 5.28 03/28/2023 1429   HGB 16.0 03/28/2023 1436   HCT 47.0 03/28/2023 1436   PLT 210 03/28/2023 1429   MCV 89.4 03/28/2023 1429   MCH 30.1 03/28/2023 1429   MCHC 33.7 03/28/2023 1429   RDW 14.0 03/28/2023 1429   LYMPHSABS 1.1 03/28/2023 1429   MONOABS 0.4 03/28/2023 1429   EOSABS 0.1 03/28/2023 1429   BASOSABS 0.0 03/28/2023 1429    BMET    Component Value Date/Time   NA 138 03/28/2023 1436   K 4.6 03/28/2023 1436   CL 103 03/28/2023 1436   CO2 26 03/28/2023 1429   GLUCOSE 112 (H) 03/28/2023 1436   BUN 18 03/28/2023 1436   CREATININE 0.90 03/28/2023 1436   CALCIUM 9.2 03/28/2023 1429   GFRNONAA >60 03/28/2023 1429    BNP No results found for: "BNP"   Imaging:  MR BRAIN WO CONTRAST  Result Date:  03/28/2023 CLINICAL DATA:  Diplopia EXAM: MRI HEAD WITHOUT CONTRAST MRA HEAD WITHOUT CONTRAST TECHNIQUE: Multiplanar, multi-echo pulse sequences of the brain and surrounding structures were acquired without intravenous contrast. Angiographic images of the Circle of Willis were acquired using MRA technique without intravenous contrast. COMPARISON:  Same day CT head FINDINGS: MR HEAD FINDINGS Brain: Negative for an acute infarct. No hydrocephalus. No extra-axial fluid collection. Encephalomalacia in the right occipital lobe adjacent to craniotomy site. No hemorrhage. Vascular: Normal flow voids.  See below Skull and upper cervical spine: Right occipital craniotomy. Sinuses/Orbits: No mastoid or middle ear effusion. Paranasal sinuses are clear. Bilateral lens replacement. Orbits are otherwise unremarkable. Other: Known MRA HEAD FINDINGS Anterior circulation: No aneurysm. No occlusion. Normal flow signal. Posterior circulation: No aneurysm. No occlusion. Normal flow signal. Anatomic variants:None IMPRESSION: 1. No acute intracranial abnormality. 2. Normal MRA of the head. Electronically Signed   By: Lorenza Cambridge M.D.   On: 03/28/2023 20:33   MR ANGIO HEAD WO CONTRAST  Result Date: 03/28/2023 CLINICAL DATA:  Diplopia EXAM: MRI HEAD WITHOUT CONTRAST MRA HEAD WITHOUT CONTRAST TECHNIQUE: Multiplanar, multi-echo pulse sequences of the brain and surrounding structures were acquired without intravenous contrast. Angiographic images of the Circle of Willis were acquired using MRA technique without intravenous contrast. COMPARISON:  Same day CT head FINDINGS: MR HEAD FINDINGS Brain: Negative for an acute infarct. No hydrocephalus. No extra-axial fluid collection. Encephalomalacia in the right occipital lobe adjacent to craniotomy site. No hemorrhage. Vascular: Normal flow voids.  See below Skull and upper cervical spine: Right occipital craniotomy. Sinuses/Orbits: No mastoid or middle ear effusion. Paranasal sinuses are  clear. Bilateral lens replacement. Orbits are otherwise unremarkable. Other: Known MRA HEAD FINDINGS Anterior circulation: No aneurysm. No occlusion. Normal flow signal. Posterior circulation: No aneurysm. No occlusion. Normal flow signal. Anatomic variants:None IMPRESSION: 1. No acute intracranial abnormality. 2. Normal MRA of the head. Electronically Signed   By: Lorenza Cambridge M.D.   On: 03/28/2023 20:33   CT  HEAD WO CONTRAST  Result Date: 03/28/2023 CLINICAL DATA:  Acute stroke symptoms EXAM: CT HEAD WITHOUT CONTRAST TECHNIQUE: Contiguous axial images were obtained from the base of the skull through the vertex without intravenous contrast. RADIATION DOSE REDUCTION: This exam was performed according to the departmental dose-optimization program which includes automated exposure control, adjustment of the mA and/or kV according to patient size and/or use of iterative reconstruction technique. COMPARISON:  07/20/2019 FINDINGS: Brain: No acute intracranial hemorrhage, new mass lesion, acute infarction, midline shift, herniation, hydrocephalus or extra-axial collection. No focal mass or edema. Posterior right occipital encephalomalacia with an overlying craniotomy compatible with sequelae from prior surgery. Cisterns are patent. No cerebellar abnormality. Vascular: No hyperdense vessel or unexpected calcification. Skull: Remote right occipital craniotomy. Mastoids are clear. No acute osseous finding. Sinuses/Orbits: Orbits are symmetric. Minor maxillary sinus disease. Other: None. IMPRESSION: 1. No acute intracranial abnormality by noncontrast CT. 2. 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 Electronically Signed   By: Judie Petit.  Shick M.D.   On: 03/28/2023 15:15          No data to display          No results found for: "NITRICOXIDE"      Assessment & Plan:   OSA (obstructive sleep apnea) Moderate OSA, on CPAP therapy. Receives benefit from use. Daytime  fatigue is likely multifactorial. We will obtain a download to ensure he is well controlled on current settings and that breakthrough events are not contributing. Cautioned on safe driving practices.  Patient Instructions  Continue to use CPAP every night, minimum of 4-6 hours a night.  Change equipment every 30 days or as directed by DME. Wash your tubing with warm soap and water daily, hang to dry. Wash humidifier portion weekly.  Be aware of reduced alertness and do not drive or operate heavy machinery if experiencing this or drowsiness.  Exercise encouraged, as tolerated. Notify if persistent daytime sleepiness occurs even with consistent use of CPAP.  We will get in contact with [facility] to see how your CPAP is working and if we need to make any adjustments. I'll call you before we do this  We discussed how untreated sleep apnea puts an individual at risk for cardiac arrhthymias, pulm HTN, DM, stroke and increases their risk for daytime accidents.   Otherwise, we will have you follow up in 4 months with Dr. Wynona Neat, one of our sleep doctors, or sooner, if needed    Obesity (BMI 30.0-34.9) BMI 31.5. Continued healthy weight loss encouraged.   Nocturia Likely related to Parkinson's and causing disruptions in his sleep. He is working with neurology on this.    I spent 35 minutes of dedicated to the care of this patient on the date of this encounter to include pre-visit review of records, face-to-face time with the patient discussing conditions above, post visit ordering of testing, clinical documentation with the electronic health record, making appropriate referrals as documented, and communicating necessary findings to members of the patients care team.  Noemi Chapel, NP 04/19/2023  Pt aware and understands NP's role.

## 2023-04-15 NOTE — Assessment & Plan Note (Signed)
BMI 31.5. Continued healthy weight loss encouraged.

## 2023-04-15 NOTE — Assessment & Plan Note (Signed)
Moderate OSA, on CPAP therapy. Receives benefit from use. Daytime fatigue is likely multifactorial. We will obtain a download to ensure he is well controlled on current settings and that breakthrough events are not contributing. Cautioned on safe driving practices.  Patient Instructions  Continue to use CPAP every night, minimum of 4-6 hours a night.  Change equipment every 30 days or as directed by DME. Wash your tubing with warm soap and water daily, hang to dry. Wash humidifier portion weekly.  Be aware of reduced alertness and do not drive or operate heavy machinery if experiencing this or drowsiness.  Exercise encouraged, as tolerated. Notify if persistent daytime sleepiness occurs even with consistent use of CPAP.  We will get in contact with [facility] to see how your CPAP is working and if we need to make any adjustments. I'll call you before we do this  We discussed how untreated sleep apnea puts an individual at risk for cardiac arrhthymias, pulm HTN, DM, stroke and increases their risk for daytime accidents.   Otherwise, we will have you follow up in 4 months with Dr. Wynona Neat, one of our sleep doctors, or sooner, if needed

## 2023-04-15 NOTE — Patient Instructions (Addendum)
Continue to use CPAP every night, minimum of 4-6 hours a night.  Change equipment every 30 days or as directed by DME. Wash your tubing with warm soap and water daily, hang to dry. Wash humidifier portion weekly.  Be aware of reduced alertness and do not drive or operate heavy machinery if experiencing this or drowsiness.  Exercise encouraged, as tolerated. Notify if persistent daytime sleepiness occurs even with consistent use of CPAP.  We will get in contact with [facility] to see how your CPAP is working and if we need to make any adjustments. I'll call you before we do this  We discussed how untreated sleep apnea puts an individual at risk for cardiac arrhthymias, pulm HTN, DM, stroke and increases their risk for daytime accidents.   Otherwise, we will have you follow up in 4 months with Dr. Wynona Neat, one of our sleep doctors, or sooner, if needed

## 2023-04-16 NOTE — Progress Notes (Unsigned)
Assessment/Plan:   1.  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  Discussed that we usually don't add further meds for this, as it often gives patient's more side effects than it does good.  He is already using propranolol as needed.  However, if he would like to try a dopamine agonist, we can certainly trial this medication.  Spent a significant amount of time discussing risk, benefits, and side effects including but not limited to compulsive behaviors and sleep attacks.  They asked several questions and I answered those to the best of my ability.  They did not want to start it yet, but wanted to research it more and think about it and would let me know.  -He asked about surgical interventions.  We discussed these today and previous visit.    2.  Hx of cerebral abscess right occiput             - status post surgical intervention in 2007 with Dr. Venetia Maxon             -unknown etiology, but no sequela   3.  Diabetic pn             -Discussed that this, along with Parkinson's, certainly can affect gait and balance.  Controlling diabetes, as well as exercise will be very important.   4.  Sleep apnea             -Patient just saw [facility]m and has appt scheduled at [facility] in July.     5.  Cranial nerve VI palsy, ischemic  -Discussed with him that these generally slowly resolve on their own.  -Diabetes is certainly a risk factor for these.  -Patient just saw ophthalmology [facility] ophthalmology, Dr. Charlotte Sanes) for this and prisms recommended  -on asa/zetia  -will check carotid u/s for completeness sake  Subjective:   Lawrence Johnson was seen today in follow up   My previous records were reviewed prior to todays visit as well as outside records available to me.  This patient is accompanied in the office by his spouse who supplements the history.  Patient was in the emergency room  with binocular diplopia April 8.  MRI brain and MRA brain were negative he followed up with the ophthalmologist on April 11.  I just got a note from her regarding that follow-up last week.   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  He saw [facility] pulm on Friday.  Notes reviewed.  Current prescribed movement disorder medications: Carbidopa/levodopa 25/100, 2 tablets at 7 AM/1 tablet at 11 AM/1 tablet at 4 PM (just change timing last visit) - he is taking it at 1 at 6am/1 at 10am/1 at 2pm/1 at 5-7pm  PREVIOUS MEDICATIONS: Sinemet  ALLERGIES:   Allergies  Allergen Reactions   Dilantin [Phenytoin] Rash    Fever   Ezetimibe-Simvastatin Other (See Comments)    Myalgias    CURRENT MEDICATIONS:  No outpatient medications have been marked as taking for the 04/18/23 encounter (Appointment) with Morissa Obeirne, Octaviano Batty, DO.     Objective:   PHYSICAL EXAMINATION:    VITALS:   There were no vitals filed for this visit.  GEN:  The patient appears stated age and is in NAD. HEENT:  Normocephalic, atraumatic.  The mucous membranes are moist. The superficial temporal arteries are without ropiness or tenderness. CV:  RRR Lungs:  CTAB Neck/HEME:  There are no carotid bruits bilaterally.  Neurological examination:  Orientation: The patient is alert and oriented x3.  Cranial nerves: There is good facial symmetry. Extraocular muscles are intact. The visual fields are full to confrontational testing. The speech is fluent and clear. Soft palate rises symmetrically and there is no tongue deviation. Hearing is intact to conversational tone. Sensation: Sensation is intact to light and pinprick throughout (facial, trunk, extremities). Vibration is intact at the bilateral big toe but it is decreased distally. There is no  extinction with double simultaneous stimulation. There is no sensory dermatomal level identified. Motor: Strength is at least antigravity x 4    Movement examination: Tone: There is normal tone in the bilateral upper extremities.  The tone in the lower extremities is normal.  Abnormal movements: there is mild RUE rest tremor that increases with distraction.  There is mild RLE tremor.    this is similar to last visit.   Coordination:  There is mild decremation with toe taps bilaterally but otherwise no other decremation Gait and Station: The patient has no difficulty arising out of a deep-seated chair without the use of the hands. The patient's stride length is good.  The patient has a neg pull test.    this is same as last visit   Total time spent on today's visit was *** minutes, including both face-to-face time and nonface-to-face time.  Time included that spent on review of records (prior notes available to me/labs/imaging if pertinent), discussing treatment and goals, answering patient's questions and coordinating care.  Cc:  Creola Corn, MD

## 2023-04-18 ENCOUNTER — Ambulatory Visit: Payer: PPO | Admitting: Neurology

## 2023-04-18 ENCOUNTER — Other Ambulatory Visit: Payer: Self-pay

## 2023-04-18 ENCOUNTER — Encounter: Payer: Self-pay | Admitting: Neurology

## 2023-04-18 VITALS — BP 120/76 | HR 77 | Ht 70.0 in | Wt 218.8 lb

## 2023-04-18 DIAGNOSIS — G20A1 Parkinson's disease without dyskinesia, without mention of fluctuations: Secondary | ICD-10-CM

## 2023-04-18 DIAGNOSIS — H532 Diplopia: Secondary | ICD-10-CM

## 2023-04-18 DIAGNOSIS — H4910 Fourth [trochlear] nerve palsy, unspecified eye: Secondary | ICD-10-CM

## 2023-04-18 NOTE — Patient Instructions (Signed)
We will order a carotid ultrasound.  The physicians and staff at [facility] are committed to providing excellent care. You may receive a survey requesting feedback about your experience at our office. We strive to receive "very good" responses to the survey questions. If you feel that your experience would prevent you from giving the office a "very good " response, please contact our office to try to remedy the situation. We may be reached at [phone]. Thank you for taking the time out of your busy day to complete the survey.

## 2023-04-19 ENCOUNTER — Encounter: Payer: Self-pay | Admitting: Nurse Practitioner

## 2023-04-26 DIAGNOSIS — D225 Melanocytic nevi of trunk: Secondary | ICD-10-CM | POA: Diagnosis not present

## 2023-04-26 DIAGNOSIS — D2261 Melanocytic nevi of right upper limb, including shoulder: Secondary | ICD-10-CM | POA: Diagnosis not present

## 2023-04-26 DIAGNOSIS — D1801 Hemangioma of skin and subcutaneous tissue: Secondary | ICD-10-CM | POA: Diagnosis not present

## 2023-04-26 DIAGNOSIS — D2262 Melanocytic nevi of left upper limb, including shoulder: Secondary | ICD-10-CM | POA: Diagnosis not present

## 2023-04-26 DIAGNOSIS — L812 Freckles: Secondary | ICD-10-CM | POA: Diagnosis not present

## 2023-04-26 DIAGNOSIS — L570 Actinic keratosis: Secondary | ICD-10-CM | POA: Diagnosis not present

## 2023-04-26 DIAGNOSIS — L821 Other seborrheic keratosis: Secondary | ICD-10-CM | POA: Diagnosis not present

## 2023-04-28 ENCOUNTER — Ambulatory Visit: Payer: PPO | Attending: Neurology | Admitting: Occupational Therapy

## 2023-04-28 ENCOUNTER — Ambulatory Visit: Payer: PPO

## 2023-04-28 DIAGNOSIS — R29818 Other symptoms and signs involving the nervous system: Secondary | ICD-10-CM | POA: Insufficient documentation

## 2023-04-28 DIAGNOSIS — R1310 Dysphagia, unspecified: Secondary | ICD-10-CM | POA: Insufficient documentation

## 2023-04-28 DIAGNOSIS — R471 Dysarthria and anarthria: Secondary | ICD-10-CM

## 2023-04-28 NOTE — Therapy (Signed)
[place] [facility] [address], [address] [place], [place], [address] Phone: [phone]   Fax:  [phone]  Patient Details  Name: Lawrence Johnson MRN: [id number] Date of Birth: 08-Mar-1951 Referring Provider:  Kerin Salen, MD  Encounter Date: 04/28/2023  Speech Therapy Parkinson's Disease Screen   Decibel Level today: low 70s dB  (WNL=70-72 dB) with sound level meter 30cm away from pt's masked Pt's conversational volume has essentially remained the same since last treatment course  Pt does not not report difficulty with swallowing, which does not warrant further evaluation  Pt does does not require speech therapy services at this time. Recommend ST screen in another 6-8 months  Fiona Coto, CCC-SLP 04/28/2023, 11:18 PM  [place]  [facility] [address], [address] [place], [place], [address] Phone: [phone]   Fax:  [phone]

## 2023-04-28 NOTE — Therapy (Signed)
[facility] [address] [place], [place], [address] Phone: [phone]   Fax:  [phone]  Patient Details  Name: Lawrence Johnson MRN: [id number] Date of Birth: June 27, 1951 Referring Provider:  Creola Corn, MD  Encounter Date: 04/28/2023  Occupational Therapy Parkinson's Disease Screen  Hand dominance:  right   Physical Performance Test item #1 (whales live in a blue ocean): 13.35 sec  Physical Performance Test item #2 (simulated eating):  11.06 sec  Fastening/unfastening 3 buttons in:  16.75 sec  9-hole peg test:    RUE  51 sec        LUE  54 sec  Other Comments:  Pt reports having a virus about a month ago and since then has been having double vision.  He saw an eye dr, who added prism lenses to his glasses, but continues to have some persistent double vision.  Pt also reports tremors having a bit more amplitude than they used to.  Pt does not require occupational therapy services at this time.  Recommended occupational therapy screen in   6-8 months.   Rosalio Loud, OT 04/28/2023, 12:19 PM  [place] [address], [address] [place], [place], [address] Phone: [phone]   Fax:  [phone]

## 2023-05-17 ENCOUNTER — Ambulatory Visit
Admission: RE | Admit: 2023-05-17 | Discharge: 2023-05-17 | Disposition: A | Discharge status: Home / Self Care | Payer: PPO | Source: Ambulatory Visit | Attending: Neurology | Admitting: Neurology

## 2023-05-17 DIAGNOSIS — H532 Diplopia: Secondary | ICD-10-CM

## 2023-05-17 DIAGNOSIS — H4910 Fourth [trochlear] nerve palsy, unspecified eye: Secondary | ICD-10-CM | POA: Diagnosis not present

## 2023-05-26 DIAGNOSIS — Z8249 Family history of ischemic heart disease and other diseases of the circulatory system: Secondary | ICD-10-CM | POA: Diagnosis not present

## 2023-05-26 DIAGNOSIS — E669 Obesity, unspecified: Secondary | ICD-10-CM | POA: Diagnosis not present

## 2023-05-26 DIAGNOSIS — H532 Diplopia: Secondary | ICD-10-CM | POA: Diagnosis not present

## 2023-05-26 DIAGNOSIS — G20A1 Parkinson's disease without dyskinesia, without mention of fluctuations: Secondary | ICD-10-CM | POA: Diagnosis not present

## 2023-05-26 DIAGNOSIS — I10 Essential (primary) hypertension: Secondary | ICD-10-CM | POA: Diagnosis not present

## 2023-05-26 DIAGNOSIS — K760 Fatty (change of) liver, not elsewhere classified: Secondary | ICD-10-CM | POA: Diagnosis not present

## 2023-05-26 DIAGNOSIS — E785 Hyperlipidemia, unspecified: Secondary | ICD-10-CM | POA: Diagnosis not present

## 2023-05-26 DIAGNOSIS — D696 Thrombocytopenia, unspecified: Secondary | ICD-10-CM | POA: Diagnosis not present

## 2023-05-26 DIAGNOSIS — I2510 Atherosclerotic heart disease of native coronary artery without angina pectoris: Secondary | ICD-10-CM | POA: Diagnosis not present

## 2023-05-26 DIAGNOSIS — E1165 Type 2 diabetes mellitus with hyperglycemia: Secondary | ICD-10-CM | POA: Diagnosis not present

## 2023-06-28 ENCOUNTER — Ambulatory Visit: Payer: PPO | Admitting: Neurology

## 2023-06-30 DIAGNOSIS — H5202 Hypermetropia, left eye: Secondary | ICD-10-CM | POA: Diagnosis not present

## 2023-06-30 DIAGNOSIS — H532 Diplopia: Secondary | ICD-10-CM | POA: Diagnosis not present

## 2023-06-30 DIAGNOSIS — H5340 Unspecified visual field defects: Secondary | ICD-10-CM | POA: Diagnosis not present

## 2023-07-13 DIAGNOSIS — G4733 Obstructive sleep apnea (adult) (pediatric): Secondary | ICD-10-CM | POA: Diagnosis not present

## 2023-07-22 NOTE — Progress Notes (Unsigned)
Assessment/Plan:   1.  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  -he continues to c/o tremor.  On propranolol as needed and hasn't taken it since retired (over last month)    2.  Hx of cerebral abscess right occiput             - status post surgical intervention in 2007 with Dr. Venetia Maxon             -unknown etiology, but no sequela   3.  Diabetic pn             -Discussed that this, along with Parkinson's, certainly can affect gait and balance.  Controlling diabetes, as well as exercise will be very important.  He is doing that part   4.  Sleep apnea             -Now following with [facility]  5.  Cranial nerve IV palsy, ischemic  -its about 90% better and continues to get better  -Diabetes is certainly a risk factor for these.  He is doing great and A1C is 5.3!  Congratulated him!  -reviewed MRI films with him today  -Follows with Dr. Charlotte Sanes  -on asa/zetia/crestor.  He didn't know his LDL today but stated PCP has been watching it.  Would like to see under 70.  -Carotid ultrasound was normal.  6.  Nocturia  -refer to [facility]  Subjective:   Roselee Nova was seen today in follow up   My previous records were reviewed prior to todays visit as well as outside records available to me.  This patient is accompanied in the office by his spouse who supplements the history.  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  He continues to c/o more tremor today.  He was here last visit and had a cranial nerve IV palsy.  He did have a carotid ultrasound which was normal.  He reports he is about 90% better.  He just retired.  He is boxing over the last month and doing PT/pilates and walking.  Having nocturia.   Requesting referral to urology  Current prescribed movement disorder medications: Carbidopa/levodopa 25/100,  he is taking it at 1 at 6am/1 at 10am/1 at 2pm/1 at 5-7pm  PREVIOUS MEDICATIONS: Sinemet  ALLERGIES:   Allergies  Allergen Reactions   Dilantin [Phenytoin] Rash    Fever   Ezetimibe-Simvastatin Other (See Comments)    Myalgias    CURRENT MEDICATIONS:  Current Meds  Medication Sig   Alpha-Lipoic Acid 600 MG CAPS Take 2 capsules (1,200 mg total) by mouth daily.   aspirin 81 MG tablet Take 81 mg by mouth daily.   carbidopa-levodopa (SINEMET IR) 25-100 MG tablet Take 1 at 6am/1 at 10am/1 at 2pm/1 at 5-7pm   cholecalciferol (VITAMIN D3) 25 MCG (1000 UNIT) tablet Take 1,000 Units by mouth daily.   cyanocobalamin (VITAMIN B12) 500 MCG tablet Take 500 mcg by mouth daily.   ezetimibe (ZETIA) 10 MG tablet TAKE 1 TABLET BY MOUTH EVERY DAY   fluticasone (FLONASE) 50 MCG/ACT nasal spray Place 1 spray into both nostrils daily.   icosapent Ethyl (VASCEPA) 1 g capsule    JARDIANCE 10 MG TABS tablet Take 10 mg by mouth daily.   loratadine (CLARITIN)  10 MG tablet Take 10 mg by mouth daily as needed.   magnesium citrate SOLN Take 1 Bottle by mouth once.   metFORMIN (GLUMETZA) 500 MG (MOD) 24 hr tablet Take 500 mg by mouth daily with breakfast.   NON FORMULARY Pectasol   NON FORMULARY Pectasol   NON FORMULARY Glypho detox   NON FORMULARY neuralli   NON FORMULARY L-arginine   NON FORMULARY L- Citrulline   Omega-3 Fatty Acids (OMEGA 3 500 PO) Take by mouth.   propranolol (INDERAL) 20 MG tablet Take 20 mg by mouth 2 (two) times daily.   rosuvastatin (CRESTOR) 20 MG tablet Take 10 mg by mouth as directed. TAKE 1/2 TABLET DAILY   testosterone (ANDROGEL) 50 MG/5GM (1%) GEL Place 5 g onto the skin daily.   valsartan (DIOVAN) 160 MG tablet Take 160 mg by mouth daily.     Objective:   PHYSICAL EXAMINATION:    VITALS:   Vitals:   07/26/23 0844  BP: 118/76  Pulse: 61  SpO2: 97%   Weight: 222 lb 3.2 oz (100.8 kg)   GEN:  The patient appears stated age and is in NAD. HEENT:  Normocephalic, atraumatic.  The mucous membranes are moist. The STA without ropiness or tenderness. CV:  RRR Lungs:  CTAB Neck/HEME:  There are no carotid bruits bilaterally.  Neurological examination:  Orientation: The patient is alert and oriented x3.  Cranial nerves: There is good facial symmetry. EOMI.   The visual fields are full to confrontational testing. The speech is fluent and clear. Soft palate rises symmetrically and there is no tongue deviation. Hearing is intact to conversational tone. Sensation: Sensation is intact to light touch throughout Motor: Strength is at least antigravity x 4    Movement examination: Tone: There is normal tone in the bilateral upper extremities.  The tone in the lower extremities is normal.  Abnormal movements: there is very mild RUE rest tremor Coordination:  There is no decremation with any form of RAMS, including alternating supination and pronation of the forearm, hand opening and closing, finger taps, heel taps and toe taps.  Gait and Station: The patient has no difficulty arising out of a deep-seated chair without the use of the hands. The patient's stride length is good.  Total time spent on today's visit was 30  minutes, including both face-to-face time and nonface-to-face time.  Time included that spent on review of records (prior notes available to me/labs/imaging if pertinent), discussing treatment and goals, answering patient's questions and coordinating care.  Cc:  Creola Corn, MD

## 2023-07-26 ENCOUNTER — Other Ambulatory Visit: Payer: Self-pay

## 2023-07-26 ENCOUNTER — Ambulatory Visit: Payer: PPO | Admitting: Neurology

## 2023-07-26 ENCOUNTER — Encounter: Payer: Self-pay | Admitting: Neurology

## 2023-07-26 VITALS — BP 118/76 | HR 61 | Ht 70.0 in | Wt 222.2 lb

## 2023-07-26 DIAGNOSIS — R351 Nocturia: Secondary | ICD-10-CM

## 2023-07-26 DIAGNOSIS — H532 Diplopia: Secondary | ICD-10-CM

## 2023-07-26 DIAGNOSIS — G20A1 Parkinson's disease without dyskinesia, without mention of fluctuations: Secondary | ICD-10-CM

## 2023-07-26 NOTE — Patient Instructions (Signed)
 SAVE THE DATE!  We are planning a Parkinsons Disease educational symposium at [facility] in [place] on October 11.  More details to come!  If you would like to be added to our email list to get further information, email [email or web address].  We hope to see you there!

## 2023-10-23 ENCOUNTER — Other Ambulatory Visit: Payer: Self-pay | Admitting: Neurology

## 2023-10-23 DIAGNOSIS — G20A1 Parkinson's disease without dyskinesia, without mention of fluctuations: Secondary | ICD-10-CM

## 2023-11-16 DIAGNOSIS — E291 Testicular hypofunction: Secondary | ICD-10-CM | POA: Diagnosis not present

## 2023-11-16 DIAGNOSIS — I10 Essential (primary) hypertension: Secondary | ICD-10-CM | POA: Diagnosis not present

## 2023-11-16 DIAGNOSIS — I2510 Atherosclerotic heart disease of native coronary artery without angina pectoris: Secondary | ICD-10-CM | POA: Diagnosis not present

## 2023-11-16 DIAGNOSIS — N401 Enlarged prostate with lower urinary tract symptoms: Secondary | ICD-10-CM | POA: Diagnosis not present

## 2023-11-16 DIAGNOSIS — Z7689 Persons encountering health services in other specified circumstances: Secondary | ICD-10-CM | POA: Diagnosis not present

## 2023-11-16 DIAGNOSIS — E1165 Type 2 diabetes mellitus with hyperglycemia: Secondary | ICD-10-CM | POA: Diagnosis not present

## 2023-11-16 DIAGNOSIS — E785 Hyperlipidemia, unspecified: Secondary | ICD-10-CM | POA: Diagnosis not present

## 2023-11-16 DIAGNOSIS — Z1212 Encounter for screening for malignant neoplasm of rectum: Secondary | ICD-10-CM | POA: Diagnosis not present

## 2023-11-21 DIAGNOSIS — E291 Testicular hypofunction: Secondary | ICD-10-CM | POA: Diagnosis not present

## 2023-11-21 DIAGNOSIS — N401 Enlarged prostate with lower urinary tract symptoms: Secondary | ICD-10-CM | POA: Diagnosis not present

## 2023-11-21 DIAGNOSIS — H532 Diplopia: Secondary | ICD-10-CM | POA: Diagnosis not present

## 2023-11-21 DIAGNOSIS — Z1331 Encounter for screening for depression: Secondary | ICD-10-CM | POA: Diagnosis not present

## 2023-11-21 DIAGNOSIS — D696 Thrombocytopenia, unspecified: Secondary | ICD-10-CM | POA: Diagnosis not present

## 2023-11-21 DIAGNOSIS — H4912 Fourth [trochlear] nerve palsy, left eye: Secondary | ICD-10-CM | POA: Diagnosis not present

## 2023-11-21 DIAGNOSIS — I2510 Atherosclerotic heart disease of native coronary artery without angina pectoris: Secondary | ICD-10-CM | POA: Diagnosis not present

## 2023-11-21 DIAGNOSIS — G4733 Obstructive sleep apnea (adult) (pediatric): Secondary | ICD-10-CM | POA: Diagnosis not present

## 2023-11-21 DIAGNOSIS — I10 Essential (primary) hypertension: Secondary | ICD-10-CM | POA: Diagnosis not present

## 2023-11-21 DIAGNOSIS — R82998 Other abnormal findings in urine: Secondary | ICD-10-CM | POA: Diagnosis not present

## 2023-11-21 DIAGNOSIS — E1165 Type 2 diabetes mellitus with hyperglycemia: Secondary | ICD-10-CM | POA: Diagnosis not present

## 2023-11-21 DIAGNOSIS — Z0000 Encounter for general adult medical examination without abnormal findings: Secondary | ICD-10-CM | POA: Diagnosis not present

## 2023-11-21 DIAGNOSIS — E669 Obesity, unspecified: Secondary | ICD-10-CM | POA: Diagnosis not present

## 2023-11-21 DIAGNOSIS — Z1339 Encounter for screening examination for other mental health and behavioral disorders: Secondary | ICD-10-CM | POA: Diagnosis not present

## 2023-11-21 DIAGNOSIS — E785 Hyperlipidemia, unspecified: Secondary | ICD-10-CM | POA: Diagnosis not present

## 2023-11-21 DIAGNOSIS — G20A1 Parkinson's disease without dyskinesia, without mention of fluctuations: Secondary | ICD-10-CM | POA: Diagnosis not present

## 2024-01-19 ENCOUNTER — Other Ambulatory Visit: Payer: Self-pay | Admitting: Neurology

## 2024-01-19 DIAGNOSIS — G20A1 Parkinson's disease without dyskinesia, without mention of fluctuations: Secondary | ICD-10-CM

## 2024-01-24 NOTE — Progress Notes (Signed)
 Assessment/Plan:   1.  Parkinsons Disease  -he takes carbidopa /levodopa  25/100, 1 at 6am/1 at 10am/1 at 2pm/1 at 5-7pm  -he is having a bit more curling of the toes in day.  Discussed DA therapy.  They asked lots of questions and we discussed r/b/se including but not limited to compulsive behaviors and sleep attacks.  He would like to try it.  Discussed how to increase pramipexole  over the next month to 0.5 mg 3 times per day.  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  Patient currently only has unilateral symptoms and has no motor fluctuations, but he does have a bit of levodopa  resistant tremor, which is somewhat bothersome.  I told him that we are happy to look into surgical interventions if he would like.  -he continues to c/o tremor.  On propranolol as needed and hasn't taken it for a long time  -Asks about supplements and those were discussed.    2.  Hx of cerebral abscess right occiput             - status post surgical intervention in 2007 with Dr. Unice             -unknown etiology, but no sequela   3.  Diabetic pn             -Discussed that this, along with Parkinson's, certainly can affect gait and balance.  Controlling diabetes, as well as exercise will be very important.  He is doing that part  -He asked about various therapies.  He really does not want to trial oral medications.  He is taking supplements, including alpha lipoic acid and he finds that to be beneficial.   4.  Sleep apnea             -Now following with [facility]  5.  Cranial nerve IV palsy, ischemic  -its about 90% better and continues to get better  -Diabetes is certainly a risk factor for these.    -Follows with Dr. McCuen  -on asa/zetia /crestor .   -Carotid ultrasound was normal.  6.   Nocturia  -he has appt with [facility] next week  Subjective:   Lawrence Johnson was seen today in follow up   My previous records were reviewed prior to todays visit as well as outside records available to me.  This patient is accompanied in the office by his spouse who supplements the history.  Patient remains on levodopa .  He has a component of levodopa  resistant tremor, which is bothersome to him.  He has had no falls since last visit.  He is exercising.  No near syncopeHe was referred [facility] after our [facility] visit.  I did not get any notes back from them.  He reports today that he has an appt next week.  Some curling/cramping of the feet toes day and night.  Night is not as big of deal as daytime.    Current prescribed movement disorder medications: Carbidopa /levodopa  25/100,  he is taking it at 1 at 6am/1 at 10am/1 at 2pm/1 at 5-7pm  PREVIOUS MEDICATIONS: Sinemet   ALLERGIES:   Allergies  Allergen Reactions   Dilantin [Phenytoin] Rash    Fever   Ezetimibe -Simvastatin Other (See Comments)    Myalgias    CURRENT MEDICATIONS:  Current  Meds  Medication Sig   Alpha-Lipoic Acid 600 MG CAPS Take 2 capsules (1,200 mg total) by mouth daily.   aspirin 81 MG tablet Take 81 mg by mouth daily.   carbidopa -levodopa  (SINEMET  IR) 25-100 MG tablet TAKE 1 TABLET AT 6AM, 1 AT 10AM, 1 AT 2PM AND 1 AT 5-7PM   cholecalciferol (VITAMIN D3) 25 MCG (1000 UNIT) tablet Take 1,000 Units by mouth daily.   cyanocobalamin  (VITAMIN B12) 500 MCG tablet Take 500 mcg by mouth daily.   ezetimibe  (ZETIA ) 10 MG tablet TAKE 1 TABLET BY MOUTH EVERY DAY   fluticasone (FLONASE) 50 MCG/ACT nasal spray Place 1 spray into both nostrils daily.   icosapent Ethyl (VASCEPA) 1 g capsule    JARDIANCE 10 MG TABS tablet Take 10 mg by mouth daily.   loratadine (CLARITIN) 10 MG tablet Take 10 mg by mouth daily as needed.   magnesium citrate SOLN Take 1 Bottle by mouth once.   metFORMIN (GLUMETZA) 500 MG (MOD) 24 hr  tablet Take 500 mg by mouth daily with breakfast.   NON FORMULARY Pectasol   NON FORMULARY Pectasol   NON FORMULARY Glypho detox   NON FORMULARY neuralli   NON FORMULARY L-arginine   NON FORMULARY L- Citrulline   Omega-3 Fatty Acids (OMEGA 3 500 PO) Take by mouth.   pramipexole  (MIRAPEX ) 0.125 MG tablet 1 po tid x 1 week, then 2 po tid x 1 week, then 3 po tid   pramipexole  (MIRAPEX ) 0.5 MG tablet Take 1 tablet (0.5 mg total) by mouth 3 (three) times daily.   propranolol (INDERAL) 20 MG tablet Take 20 mg by mouth 2 (two) times daily.   rosuvastatin  (CRESTOR ) 20 MG tablet Take 10 mg by mouth as directed. TAKE 1/2 TABLET DAILY   testosterone  (ANDROGEL ) 50 MG/5GM (1%) GEL Place 5 g onto the skin daily.   valsartan (DIOVAN) 160 MG tablet Take 160 mg by mouth daily.     Objective:   PHYSICAL EXAMINATION:    VITALS:   Vitals:   01/26/24 1031  BP: 122/82  Pulse: (!) 57  SpO2: 98%  Weight: 231 lb 12.8 oz (105.1 kg)    GEN:  The patient appears stated age and is in NAD. HEENT:  Normocephalic, atraumatic.  The mucous membranes are moist. The STA without ropiness or tenderness. CV: Bradycardic.  Regular. Lungs:  CTAB Neck/HEME:  There are no carotid bruits bilaterally.  Neurological examination:  Orientation: The patient is alert and oriented x3.  Cranial nerves: There is good facial symmetry. EOMI.   The visual fields are full to confrontational testing. The speech is fluent and clear. Soft palate rises symmetrically and there is no tongue deviation. Hearing is intact to conversational tone. Sensation: Sensation is intact to light touch throughout Motor: Strength is at least antigravity x 4    Movement examination: Tone: There is normal tone in the bilateral upper extremities.  The tone in the lower extremities is normal.  Abnormal movements: there is mild to mod RUE rest tremor and mild RLE rest tremor Coordination:  There is mild decremation with finger and toe taps on the R.   All other RAMs are good Gait and Station: The patient has no difficulty arising out of a deep-seated chair without the use of the hands. The patient's stride length is good.  He has good arm swing  Total time spent on today's visit was 45  minutes, including both face-to-face time and nonface-to-face time.  Time included that spent on review  of records (prior notes available to me/labs/imaging if pertinent), discussing treatment and goals, answering patient's questions and coordinating care.  Cc:  Onita Rush, MD

## 2024-01-26 ENCOUNTER — Ambulatory Visit: Payer: Medicare PPO | Admitting: Neurology

## 2024-01-26 VITALS — BP 122/82 | HR 57 | Wt 231.8 lb

## 2024-01-26 DIAGNOSIS — E1142 Type 2 diabetes mellitus with diabetic polyneuropathy: Secondary | ICD-10-CM | POA: Diagnosis not present

## 2024-01-26 DIAGNOSIS — G20A1 Parkinson's disease without dyskinesia, without mention of fluctuations: Secondary | ICD-10-CM | POA: Diagnosis not present

## 2024-01-26 MED ORDER — PRAMIPEXOLE DIHYDROCHLORIDE 0.5 MG PO TABS: 0.5000 mg | ORAL_TABLET | Freq: Three times a day (TID) | ORAL | 1 refills | Status: DC

## 2024-01-26 MED ORDER — PRAMIPEXOLE DIHYDROCHLORIDE 0.125 MG PO TABS: ORAL_TABLET | ORAL | 0 refills | Status: DC

## 2024-01-26 NOTE — Patient Instructions (Addendum)
 Start mirapex  (pramipexole ) as follows:  0.125 mg - 1 tablet three times per day for a week, then 2 tablets three times per day for a week, then 3 tablets three times per day for a week and then fill the 0.5 mg tablet and take 1 of those three times per day  The physicians and staff at [facility] are committed to providing excellent care. You may receive a survey requesting feedback about your experience at our office. We strive to receive very good responses to the survey questions. If you feel that your experience would prevent you from giving the office a very good  response, please contact our office to try to remedy the situation. We may be reached at [phone]. Thank you for taking the time out of your busy day to complete the survey.

## 2024-02-02 ENCOUNTER — Ambulatory Visit: Payer: Medicare PPO

## 2024-02-02 ENCOUNTER — Ambulatory Visit: Payer: Medicare PPO | Attending: Neurology | Admitting: Occupational Therapy

## 2024-02-02 ENCOUNTER — Ambulatory Visit: Payer: Medicare PPO | Admitting: Physical Therapy

## 2024-02-02 DIAGNOSIS — R471 Dysarthria and anarthria: Secondary | ICD-10-CM | POA: Insufficient documentation

## 2024-02-02 DIAGNOSIS — R2689 Other abnormalities of gait and mobility: Secondary | ICD-10-CM | POA: Insufficient documentation

## 2024-02-02 DIAGNOSIS — R29818 Other symptoms and signs involving the nervous system: Secondary | ICD-10-CM | POA: Insufficient documentation

## 2024-02-02 DIAGNOSIS — R1310 Dysphagia, unspecified: Secondary | ICD-10-CM

## 2024-02-02 NOTE — Therapy (Signed)
[place] [address] [place], [place], [address] Phone: [phone]   Fax:  [phone]  Patient Details  Name: Lawrence Johnson MRN: [id number] Date of Birth: 01-21-1951 Referring Provider:  Vladimir Faster, DO  Encounter Date: 02/02/2024  Speech Therapy Parkinson's Disease Screen   Decibel Level today: 70dB  (WNL=70-72 dB) with sound level meter 30cm away from pt's mouth. Pt's conversational volume has remained the same since last treatment course  Pt does not report difficulty with swallowing, which does not warrant further evaluation  Pt does not require speech therapy services at this time. Recommend ST screen in another 6-8 months  Corisa Montini, CCC-SLP 02/02/2024, 11:59 AM  [place] [facility] [address] [place], [place], [address] Phone: [phone]   Fax:  [phone]

## 2024-02-02 NOTE — Therapy (Signed)
[place] [address] [place], [place], [address] Phone: [phone]   Fax:  [phone]  Patient Details  Name: Lawrence Johnson MRN: [id number] Date of Birth: August 21, 1951 Referring Provider:  Vladimir Faster, DO  Encounter Date: 02/02/2024  Physical Therapy Parkinson's Disease Screen   Timed Up and Go test: 8.78 sec (compared to 9.88 sec)  10 meter walk test: 7.37 sec (4.45 ft/sec) (compared to 4.58 ft/sec)  5 time sit to stand test:9.00 sec (compared to 9.07 sec)   Patient does not require Physical Therapy services at this time.  Recommend Physical Therapy screen in 6-9 months.  Pt reports he is exercising almost daily and continues to do his PWR! Moves exercises.  Raife Lizer W., PT 02/02/2024, 10:17 AM  [place] [facility] [address] [place], [place], [address] Phone: [phone]   Fax:  [phone]

## 2024-02-02 NOTE — Therapy (Signed)
[place] [facility] [address] [place], [place], [address] Phone: [phone]   Fax:  [phone]  Patient Details  Name: Lawrence Johnson MRN: [id number] Date of Birth: [birth year] Referring Provider:  Vladimir Faster, DO  Encounter Date: 02/02/2024  Occupational Therapy Parkinson's Disease Screen  Hand dominance:  right   Physical Performance Test item #1 (whales live in a blue ocean):  09.96 sec; 100% legibility, no micrographia  9-hole peg test:    RUE  25.41 sec        LUE  23.66 sec  Box & Blocks Test:   RUE  55 blocks        LUE  61 blocks  Other Comments:  Pt reports that his tremor has been more prevalent recently - noticing impact mostly on grip.  He reports now that he is retired he is exercising more and going to more PD exercise classes.  Pt with resting tremor in dominant RUE, however not impacting coordination tasks assessed during this screen.  Pt does not require occupational therapy services at this time.  Recommended occupational therapy screen in  6-9 months.   Rosalio Loud, OT 02/02/2024, 11:48 AM  [place] [facility] [address] [place], [place], [address] Phone: [phone]   Fax:  [phone]

## 2024-02-08 ENCOUNTER — Other Ambulatory Visit: Payer: Self-pay | Admitting: Cardiology

## 2024-02-17 ENCOUNTER — Other Ambulatory Visit: Payer: Self-pay | Admitting: Neurology

## 2024-03-13 ENCOUNTER — Other Ambulatory Visit: Payer: Self-pay | Admitting: Cardiology

## 2024-03-17 ENCOUNTER — Other Ambulatory Visit: Payer: Self-pay | Admitting: Cardiology

## 2024-03-27 ENCOUNTER — Other Ambulatory Visit: Payer: Self-pay | Admitting: Cardiology

## 2024-03-31 ENCOUNTER — Other Ambulatory Visit: Payer: Self-pay | Admitting: Cardiology

## 2024-04-17 ENCOUNTER — Other Ambulatory Visit: Payer: Self-pay | Admitting: Neurology

## 2024-04-17 DIAGNOSIS — G20A1 Parkinson's disease without dyskinesia, without mention of fluctuations: Secondary | ICD-10-CM

## 2024-04-19 ENCOUNTER — Other Ambulatory Visit: Payer: Self-pay | Admitting: Cardiology

## 2024-04-20 NOTE — Telephone Encounter (Signed)
 Dr. Atlas Lea pt. Hasn't been seen since 02/2022. He received his 3rd attempt 2 weeks ago. Does Dr. Lavonne Prairie want to refill? Please advise.

## 2024-04-25 ENCOUNTER — Other Ambulatory Visit: Payer: Self-pay | Admitting: Cardiology

## 2024-07-13 ENCOUNTER — Other Ambulatory Visit: Payer: Self-pay | Admitting: Neurology

## 2024-07-13 DIAGNOSIS — G20A1 Parkinson's disease without dyskinesia, without mention of fluctuations: Secondary | ICD-10-CM

## 2024-07-26 ENCOUNTER — Ambulatory Visit: Payer: Medicare PPO | Admitting: Neurology

## 2024-07-26 NOTE — Progress Notes (Signed)
 Assessment/Plan:   1.  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  Patient currently only has unilateral symptoms and has no motor fluctuations, but he does have a bit of levodopa  resistant tremor, which is somewhat bothersome.  I told him that we are happy to look into surgical interventions if he would like.   2.  Hx of cerebral abscess right occiput             - status post surgical intervention in 2007 with Dr. Unice             -unknown etiology, but no sequela   3.  Diabetic pn             -Discussed that this, along with Parkinson's, certainly can affect gait and balance.  Controlling diabetes, as well as exercise will be very important.  He is doing that part  -He asked about various therapies.  He really does not want to trial oral medications.  He is taking supplements, including alpha lipoic acid and he finds that to be beneficial.   4.  Sleep apnea and RBD             -Now following with [facility] but hasn't been seen in a while and needs f/u.  We will try and arrange that.  5.  Cranial nerve IV palsy, ischemic  - Resolved  -Diabetes is certainly a risk factor for these.    -Follows with Dr. McCuen  -on asa/zetia /crestor .   -Carotid ultrasound was normal.  6.  Nocturia  -he has appt with [facility] next week  7.  Memory change  - I am not particularly concerned about a neurodegenerative memory change in this patient, but they are interested in moving to [facility] and had to do a MoCA and there was concerns about his scores.  We discussed neurocognitive testing and patient would like to proceed.  He finds that it would be a useful baseline in case he  wants to proceed with DBS in the future, but knows he may have to repeat that in the future if we do not do DBS for a while.  Subjective:   Lawrence Johnson was seen today in follow up   My previous records were reviewed prior to todays visit as well as outside records available to me.  This patient is accompanied in the office by his spouse who supplements the history.  He takes his levodopa  faithfully.  He started pramipexole  last visit in hopes that it would help some curling of the toes.  It has not helped the curling of the toes but it may have helped the tremor some.  He has had no compulsive behaviors or sleep attacks.  He has had some visual distortions.  No frank hallucinations.  He has not had falls.  He has had vivid dreams and has woke up fighting.   He is exercising faithfully.   He had therapy screens back in February and did well with those.  They note that they have started to look at [facility] and he apparently had to have a MoCA done and wife was concerned that he got a 24 as  [place] told him that the cut off for normal screening was 26.  Patient notes that he had trouble with the rhinoceros.  He also notes that he missed 1 on the math.  Current prescribed movement disorder medications: Carbidopa /levodopa  25/100,  he is taking it at 1 at 6am/1 at 10am/1 at 2pm/1 at 5-7pm Pramipexole  0.5 mg 3 times per day (started last visit)  PREVIOUS MEDICATIONS: Sinemet   ALLERGIES:   Allergies  Allergen Reactions   Dilantin [Phenytoin] Rash    Fever   Ezetimibe -Simvastatin Other (See Comments)    Myalgias    CURRENT MEDICATIONS:  Current Meds  Medication Sig   Alpha-Lipoic Acid 600 MG CAPS Take 2 capsules (1,200 mg total) by mouth daily.   aspirin 81 MG tablet Take 81 mg by mouth daily.   carbidopa -levodopa  (SINEMET  IR) 25-100 MG tablet TAKE 1 TABLET AT 6AM, 1 AT 10AM, 1 AT 2PM AND 1 AT 5-7PM   cholecalciferol (VITAMIN D3) 25 MCG (1000 UNIT) tablet Take 1,000 Units by mouth daily.    cyanocobalamin  (VITAMIN B12) 500 MCG tablet Take 500 mcg by mouth daily.   ezetimibe  (ZETIA ) 10 MG tablet Take 1 tablet (10 mg total) by mouth daily. PT. MUST MAKE AN APPOINTMENT IN ORDER TO RECEIVE FUTURE REFILLS. THIRD AND FINAL ATTEMPT.   fluticasone (FLONASE) 50 MCG/ACT nasal spray Place 1 spray into both nostrils daily.   icosapent Ethyl (VASCEPA) 1 g capsule    JARDIANCE 10 MG TABS tablet Take 10 mg by mouth daily.   loratadine (CLARITIN) 10 MG tablet Take 10 mg by mouth daily as needed.   magnesium citrate SOLN Take 1 Bottle by mouth once.   metFORMIN (GLUMETZA) 500 MG (MOD) 24 hr tablet Take 500 mg by mouth daily with breakfast.   NON FORMULARY Pectasol   NON FORMULARY Pectasol   NON FORMULARY Glypho detox   NON FORMULARY neuralli   NON FORMULARY L-arginine   NON FORMULARY L- Citrulline   Omega-3 Fatty Acids (OMEGA 3 500 PO) Take by mouth.   pramipexole  (MIRAPEX ) 0.5 MG tablet TAKE 1 TABLET BY MOUTH 3 TIMES DAILY.   propranolol (INDERAL) 20 MG tablet Take 20 mg by mouth 2 (two) times daily.   rosuvastatin  (CRESTOR ) 20 MG tablet Take 10 mg by mouth as directed. TAKE 1/2 TABLET DAILY   testosterone  (ANDROGEL ) 50 MG/5GM (1%) GEL Place 5 g onto the skin daily.   valsartan (DIOVAN) 160 MG tablet Take 160 mg by mouth daily.     Objective:   PHYSICAL EXAMINATION:    VITALS:   Vitals:   07/30/24 1103  BP: 128/84  Pulse: 64  SpO2: 98%  Weight: 231 lb 9.6 oz (105.1 kg)  Height: 5' 10 (1.778 m)     GEN:  The patient appears stated age and is in NAD. HEENT:  Normocephalic, atraumatic.  The mucous membranes are moist. The STA without ropiness or tenderness. CV: rrr Lungs:  CTAB Neck/HEME:  There are no carotid bruits bilaterally.  Neurological examination:  Orientation: The patient is alert and oriented x3.  Cranial nerves: There is good facial symmetry. EOMI.   The speech is fluent and clear. Soft palate rises symmetrically and there is no tongue deviation. Hearing is  intact to conversational tone. Sensation: Sensation is intact to light touch throughout Motor: Strength is at least antigravity x 4    Movement examination: Tone: There is normal tone in the bilateral upper extremities.  The tone in the lower extremities is normal.  Abnormal movements:  there is mild RUE rest tremor and rare RLE rest tremor Coordination:  There is no decremation with any form of RAMS, including alternating supination and pronation of the forearm, hand opening and closing, finger taps, heel taps and toe taps.  Gait and Station: The patient has no difficulty arising out of a deep-seated chair without the use of the hands. The patient's stride length is good.  He has good arm swing.  Neg pull test  Total time spent on today's visit was 40 minutes, including both face-to-face time and nonface-to-face time.  Time included that spent on review of records (prior notes available to me/labs/imaging if pertinent), discussing treatment and goals, answering patient's questions and coordinating care.  Cc:  Onita Rush, MD

## 2024-07-30 ENCOUNTER — Ambulatory Visit: Payer: Medicare PPO | Admitting: Neurology

## 2024-07-30 VITALS — BP 128/84 | HR 64 | Ht 70.0 in | Wt 231.6 lb

## 2024-07-30 DIAGNOSIS — G20A1 Parkinson's disease without dyskinesia, without mention of fluctuations: Secondary | ICD-10-CM

## 2024-07-30 DIAGNOSIS — G4752 REM sleep behavior disorder: Secondary | ICD-10-CM

## 2024-07-30 DIAGNOSIS — R413 Other amnesia: Secondary | ICD-10-CM

## 2024-07-30 NOTE — Patient Instructions (Signed)
 You have been referred for a neurocognitive evaluation (i.e., evaluation of memory and thinking abilities). Please bring someone with you to this appointment if possible, as it is helpful for the neuropsychologist to hear from both you and another adult who knows you well. Please bring eyeglasses and hearing aids if you wear them and take any medications as you normally would. Please fully abstain from all alcohol, marijuana, or other substances prior to your appointment.   The evaluation will take approximately 2-3 hours and has two parts:   The first part is a clinical interview with the neuropsychologist, Dr. Richie or Dr. Gayland. During the interview, the neuropsychologist will speak with you and the individual you brought to the appointment.    The second part of the evaluation is testing with the doctor's technician, aka psychometrician, Dana or [employer]. During the testing, the technician will ask you to remember different types of material, solve problems, and answer some questionnaires. Your family member will not be present for this portion of the evaluation.   Please note: We have to reserve several hours of the neuropsychologist's time and the psychometrician's time for your evaluation appointment. As such, there is a No-Show fee of $100. If you are unable to attend any of your appointments, please contact our office as soon as possible to reschedule.

## 2024-08-24 DIAGNOSIS — G4733 Obstructive sleep apnea (adult) (pediatric): Secondary | ICD-10-CM | POA: Diagnosis not present

## 2024-09-08 ENCOUNTER — Other Ambulatory Visit: Payer: Self-pay | Admitting: Cardiology

## 2024-09-20 ENCOUNTER — Ambulatory Visit: Payer: Self-pay

## 2024-09-20 ENCOUNTER — Institutional Professional Consult (permissible substitution): Admitting: Psychology

## 2024-10-05 ENCOUNTER — Ambulatory Visit

## 2024-10-05 ENCOUNTER — Encounter: Payer: Self-pay | Admitting: *Deleted

## 2024-10-05 VITALS — BP 125/83 | HR 69 | Temp 98.0°F | Ht 70.0 in | Wt 229.0 lb

## 2024-10-05 DIAGNOSIS — G20A1 Parkinson's disease without dyskinesia, without mention of fluctuations: Secondary | ICD-10-CM

## 2024-10-05 DIAGNOSIS — G4733 Obstructive sleep apnea (adult) (pediatric): Secondary | ICD-10-CM

## 2024-10-05 DIAGNOSIS — G4752 REM sleep behavior disorder: Secondary | ICD-10-CM

## 2024-10-05 NOTE — Patient Instructions (Addendum)
 VISIT SUMMARY: You came in today to discuss your CPAP compliance and management of REM behavior disorder. We reviewed your current treatment for sleep apnea and discussed your recent experiences with REM behavior disorder. We also touched on your ongoing management of Parkinson's disease.  YOUR PLAN: -OBSTRUCTIVE SLEEP APNEA: Obstructive sleep apnea is a condition where your breathing stops and starts during sleep. You are managing this with CPAP therapy, which has improved your sleep quality. We have set you up with a new DME company for your CPAP supplies and compliance data. Additionally, you were given paperwork on healthy sleep habits.  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 You had one episode of 'punching in the air' during sleep in the last three months. Please call the office if your symptoms recur or worsen.  -PARKINSON'S DISEASE: Parkinson's disease is a disorder of the nervous system that affects movement. You are currently being managed by a movement specialist. Continue with your current management plan and monitor for any changes or new symptoms.  INSTRUCTIONS: Please follow up with your movement specialist for Parkinson's disease management. Call our office if your REM sleep behavior disorder symptoms recur or worsen.    - Maintain side sleeping. - Maintain fixed sleep-wake schedule even during weekends, avoid naps during the day. - Avoid alcohol , caffeine and nicotine at least 4 hours before bedtime. - Avoid watching TV, reading on your phone or computer to-3 hours before bedtime. - Use bed only for sleeping or sexual activity.                 Contains text generated by Abridge.                                 Contains text generated by Abridge.   VISIT SUMMARY: You came in today to discuss your CPAP compliance and management of REM behavior  disorder. We reviewed your current treatment for sleep apnea and discussed your recent experiences with REM behavior disorder. We also touched on your ongoing management of Parkinson's disease.  YOUR PLAN: -OBSTRUCTIVE SLEEP APNEA: Obstructive sleep apnea is a condition where your breathing stops and starts during sleep. You are managing this with CPAP therapy, which has improved your sleep quality. We have set you up with a new DME company for your CPAP supplies and compliance data. Additionally, you were given paperwork on healthy sleep habits.  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 You had one episode of 'punching in the air' during sleep in the last three months. If your symptoms worsen, you can consider taking 5-10 mg of over-the-counter melatonin. Please call the office if your symptoms recur or worsen.  -PARKINSON'S DISEASE: Parkinson's disease is a disorder of the nervous system that affects movement. You are currently being managed by a movement specialist. Continue with your current management plan and monitor for any changes or new symptoms.  INSTRUCTIONS: Please follow up with your movement specialist for Parkinson's disease management. Call our office if your REM sleep behavior disorder symptoms recur or worsen.                      Contains text generated by Abridge.  Contains text generated by Abridge.

## 2024-10-05 NOTE — Progress Notes (Addendum)
 Pulmonology Office Visit   Subjective:  Patient ID: Lawrence Johnson, male    DOB: 28-May-1951  MRN: [id number]  Referred by: Evonnie Asberry RAMAN, DO  CC: No chief complaint on file.   HPI Lawrence Johnson is a 73 y.o. male with DM2, HTN, RBD and now Parkinsons disease in 2023 follows Dr Tat, morbid obesity, HLD, OSA s/p UPPP and tonsillectomy now on CPAP.   Discussed the use of AI scribe software for clinical note transcription with the patient, who gave verbal consent to proceed.  History of Present Illness   Lawrence Johnson is a 73 year old male with sleep apnea and Parkinson's disease who presents for evaluation of CPAP compliance and management of REM behavior disorder. He was referred by Dr. Evonnie for evaluation OSA management and for REM behavior disorder. He used to see Dr Margean from Neurology prior for OSA.   He was diagnosed with sleep apnea in 2021 and uses a CPAP with a full face mask from 10 PM to 6 AM, occasionally removing it at 4:30 AM. He wakes twice nightly to urinate but falls back asleep quickly. Does not take naps. CPAP has improved his sleep quality, with noticeable differences when not using it.  He has REM behavior disorder, with a single episode of 'punching in the air' during sleep. He does not regularly use melatonin and does act out dreams.  He was diagnosed with Parkinson's disease two years ago, experiencing fatigue and worsening tremors. His wife previously noticed apneic episodes before CPAP therapy. He is retired, does not smoke, drinks alcohol  occasionally, and consumes decaffeinated coffee.       Non smoker. Rarely drinks alcohol  or coffee. 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   OSA history: Diagnosed in 2021.  Was started on auto CPAP.      04/15/2023    8:00 AM  Results of the Epworth flowsheet  Sitting and reading 2  Watching TV 2  Sitting, inactive in a public place (e.g. a theatre or a  meeting) 0  As a passenger in a car for an hour without a break 0  Lying down to rest in the afternoon when circumstances permit 2  Sitting and talking to someone 0  Sitting quietly after a lunch without alcohol  0  In a car, while stopped for a few minutes in traffic 0  Total score 6    PAP download compliance data: could not be obtained.  Encore/Airview Has resmed.  Pressure: ?6-15 cm H2O. Per old order.  Hours of usage: Days used >4hr: Leak: AHI:  PRIOR TESTS and IMAGING: PSG/HSAT:  2021: Showed AHI 17, REM dominant desaturation for 14 minutes, O2 nadir 75.  MRI Brain: MRA brain 2024: No CVA seen.     04/15/2023    8:00 AM  Results of the Epworth flowsheet  Sitting and reading 2  Watching TV 2  Sitting, inactive in a public place (e.g. a theatre or a meeting) 0  As a passenger in a car for an hour without a break 0  Lying down to rest in the afternoon when circumstances permit 2  Sitting and talking to someone 0  Sitting quietly after a lunch without alcohol  0  In a car, while stopped for a few minutes in traffic 0  Total score 6    Allergies: Dilantin [phenytoin] and Ezetimibe -simvastatin  Current Outpatient Medications:    Alpha-Lipoic Acid 600 MG CAPS,  Take 2 capsules (1,200 mg total) by mouth daily., Disp: 60 capsule, Rfl: 1   aspirin 81 MG tablet, Take 81 mg by mouth daily., Disp: , Rfl:    carbidopa -levodopa  (SINEMET  IR) 25-100 MG tablet, TAKE 1 TABLET AT 6AM, 1 AT 10AM, 1 AT 2PM AND 1 AT 5-7PM, Disp: 360 tablet, Rfl: 0   cholecalciferol (VITAMIN D3) 25 MCG (1000 UNIT) tablet, Take 1,000 Units by mouth daily., Disp: , Rfl:    cyanocobalamin  (VITAMIN B12) 500 MCG tablet, Take 500 mcg by mouth daily., Disp: , Rfl:    ezetimibe  (ZETIA ) 10 MG tablet, TAKE 1 TABLET (10 MG TOTAL) BY MOUTH DAILY. PT. MUST MAKE AN APPOINTMENT IN ORDER TO RECEIVE FUTURE REFILLS. THIRD AND FINAL ATTEMPT., Disp: 7 tablet, Rfl: 0   fluticasone (FLONASE) 50 MCG/ACT nasal spray, Place 1 spray  into both nostrils daily., Disp: , Rfl:    icosapent Ethyl (VASCEPA) 1 g capsule, , Disp: , Rfl:    JARDIANCE 10 MG TABS tablet, Take 10 mg by mouth daily., Disp: , Rfl:    loratadine (CLARITIN) 10 MG tablet, Take 10 mg by mouth daily as needed., Disp: , Rfl:    magnesium citrate SOLN, Take 1 Bottle by mouth once., Disp: , Rfl:    metFORMIN (GLUMETZA) 500 MG (MOD) 24 hr tablet, Take 500 mg by mouth daily with breakfast., Disp: , Rfl:    NON FORMULARY, Pectasol, Disp: , Rfl:    NON FORMULARY, Pectasol, Disp: , Rfl:    NON FORMULARY, Glypho detox, Disp: , Rfl:    NON FORMULARY, neuralli, Disp: , Rfl:    NON FORMULARY, L-arginine, Disp: , Rfl:    NON FORMULARY, L- Citrulline, Disp: , Rfl:    Omega-3 Fatty Acids (OMEGA 3 500 PO), Take by mouth., Disp: , Rfl:    pramipexole  (MIRAPEX ) 0.5 MG tablet, TAKE 1 TABLET BY MOUTH 3 TIMES DAILY., Disp: 270 tablet, Rfl: 0   propranolol (INDERAL) 20 MG tablet, Take 20 mg by mouth 2 (two) times daily., Disp: , Rfl:    rosuvastatin  (CRESTOR ) 20 MG tablet, Take 10 mg by mouth as directed. TAKE 1/2 TABLET DAILY, Disp: , Rfl:    testosterone  (ANDROGEL ) 50 MG/5GM (1%) GEL, Place 5 g onto the skin daily., Disp: , Rfl:    valsartan (DIOVAN) 160 MG tablet, Take 160 mg by mouth daily., Disp: , Rfl:  Past Medical History:  Diagnosis Date   Angiodysplasia of colon 05/23/2020   2 mm ascending   Arthritis    Asthma    Blood transfusion without reported diagnosis    Brain abscess    Diabetes mellitus without complication (HCC)    Elevated LFTs    Fatty liver    GERD (gastroesophageal reflux disease)    Hx of colonic polyp - adenoma 07/29/2009   Hyperglycemia    Hyperlipidemia    Hypertension    Obesity    Past Surgical History:  Procedure Laterality Date   BRAIN SURGERY     strep lodged in visual cortex   COLONOSCOPY     Right Leg Injury     [place] accident.  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  He has no children. Right handed   Social Drivers of Corporate investment banker Strain: Not on file  Food Insecurity: Not on file  Transportation Needs: Not on file  Physical Activity: Not on file  Stress: Not on file  Social Connections: Not on file  Intimate Partner Violence: Not on file       Objective:  BP 125/83   Pulse 69   Temp 98 F (36.7 C) (Oral)   Ht 5' 10 (1.778 m)   Wt 229 lb (103.9 kg)   SpO2 94%   BMI 32.86 kg/m  BMI Readings from Last 3 Encounters:  10/05/24 32.86 kg/m  07/30/24 33.23 kg/m  01/26/24 33.26 kg/m    Physical Exam: Physical Exam   ENT: Uvula absent. Nose normal. PULMONARY: Lungs clear to auscultation bilaterally.       Diagnostic Review:  Last CBC Lab Results  Component Value Date   WBC 7.0 03/28/2023   HGB 16.0 03/28/2023   HCT 47.0 03/28/2023   MCV 89.4 03/28/2023   MCH 30.1 03/28/2023   RDW 14.0 03/28/2023   PLT 210 03/28/2023          Assessment & Plan:  Assessment and Plan    Mild Obstructive sleep apnea Managed with CPAP therapy, improved daytime sleepiness, nocturia twice per night. - Set up with new DME company for CPAP supplies and compliance data. - Provided paperwork on healthy sleep habits.  REM sleep behavior disorder One episode of punching in sleep over the last three months, associated with Parkinsonism. - will consider melatonin 5-10 mg OTC if symptoms worsen. - Advised to call the office if symptoms recur or worsen.  Parkinson's disease Managed by movement specialist, symptoms include tremors and REM behavior disorder. - Continue management with movement specialist. - Monitor for symptom changes or new developments.      He was counselled about not driving while drowsy which is common side effect of sleep related disorders.   Return in about 6 months (around 04/05/2025).   I personally spent a total of 30 minutes in the care of the patient today including preparing to see the patient, getting/reviewing separately obtained history, performing a medically appropriate exam/evaluation, counseling and educating, placing orders, documenting clinical information in the EHR, independently interpreting results, and communicating results.   Pedro Whiters, MD

## 2024-10-10 ENCOUNTER — Other Ambulatory Visit: Payer: Self-pay | Admitting: Neurology

## 2024-10-10 DIAGNOSIS — G20A1 Parkinson's disease without dyskinesia, without mention of fluctuations: Secondary | ICD-10-CM

## 2024-10-11 ENCOUNTER — Encounter: Admitting: Psychology

## 2024-10-18 ENCOUNTER — Ambulatory Visit: Payer: Medicare PPO | Admitting: Occupational Therapy

## 2024-10-18 ENCOUNTER — Ambulatory Visit: Payer: Medicare PPO | Admitting: Physical Therapy

## 2024-10-18 ENCOUNTER — Ambulatory Visit: Payer: Medicare PPO | Attending: Neurology

## 2024-10-18 DIAGNOSIS — R2689 Other abnormalities of gait and mobility: Secondary | ICD-10-CM | POA: Insufficient documentation

## 2024-10-18 DIAGNOSIS — R1310 Dysphagia, unspecified: Secondary | ICD-10-CM | POA: Insufficient documentation

## 2024-10-18 DIAGNOSIS — R29818 Other symptoms and signs involving the nervous system: Secondary | ICD-10-CM | POA: Insufficient documentation

## 2024-10-18 DIAGNOSIS — R471 Dysarthria and anarthria: Secondary | ICD-10-CM | POA: Insufficient documentation

## 2024-10-18 NOTE — Therapy (Signed)
 [place] [facility] [address], [address] [place], [place], [address] Phone: [phone]   Fax:  [phone]  Patient Details  Name: Lawrence Johnson MRN: [id number] Date of Birth: Dec 25, 1950 Referring Provider:  Evonnie Asberry RAMAN, DO  Encounter Date: 10/18/2024  Occupational Therapy Parkinson's Disease Screen  Hand dominance:  right   9-hole peg test:    RUE  28.0 sec        LUE  24.50 sec  Box & Blocks Test:   RUE  54 blocks        LUE  60 blocks  Other Comments:  Pt reports tremor has gotten worse in the last 6 months but is not impacting his function.  Pt does not require occupational therapy services at this time.  Recommended occupational therapy screen in  6-8 months   [place], [place], OT 10/18/2024, 12:20 PM  [place] [facility] [address] [place], [place], [address] Phone: [phone]   Fax:  [phone]

## 2024-10-18 NOTE — Therapy (Signed)
 [place] [facility] [address], [address] [place], [place], [address] Phone: [phone]   Fax:  [phone]  Patient Details  Name: Lawrence Johnson MRN: [id number] Date of Birth: [birth year] Referring Provider:  Evonnie Asberry RAMAN, DO  Encounter Date: 10/18/2024  Speech Therapy Parkinson's Disease Screen   Decibel Level today: 72dB  (WNL=70-72 dB) with sound level meter 30cm away from pt's mouth. Pt's conversational volume has remained WNL.  Pt does not report difficulty with swallowing, which does not warrant further evaluation.   Pt does endorse changes in cognition. Pt reports slightly slower processing, pausing with loss of train of thought. Pt would like to wait for now on this therapy.  Pt does does not require speech therapy services at this time. Recommend ST screen in another 6-8 months   Simrat Kendrick, CCC-SLP 10/18/2024, 11:45 AM  [place] [facility] [address] [place], [place], [address] Phone: [phone]   Fax:  [phone]

## 2024-10-18 NOTE — Therapy (Signed)
 [place] [facility] [address] [place], [place], [address] Phone: [phone]   Fax:  [phone]  Patient Details  Name: Lawrence Johnson MRN: [id number] Date of Birth: [birth year] Referring Provider:  Evonnie Asberry RAMAN, DO  Encounter Date: 10/18/2024  Physical Therapy Parkinson's Disease Screen   Timed Up and Go test: 9.56 sec (compared to 8.78 sec)  10 meter walk test: 7.31 sec =  4.48 ft/sec(compared to 4.45 ft/sec)  5 time sit to stand test: 9.41 sec (compared to 9 sec)  Patient does not require Physical Therapy services at this time.  Recommend Physical Therapy screen in 6-9 months Doing more exercise, trying to walk 2 miles per day, weightlifting x 3 days, [employer] and cycling at [employer], [employer] PT 10/18/2024, 11:48 AM  [place] [facility] [address], [address] [place], [place], [address] Phone: [phone]   Fax:  [phone]

## 2024-11-07 ENCOUNTER — Telehealth: Payer: Self-pay | Admitting: *Deleted

## 2024-11-07 NOTE — Telephone Encounter (Signed)
 Informed patient mychart message was sent to him last month requesting serial #'s from cpap machine in order to get him set up. Patient will call office back later this pm with info.    Copied from CRM [id number]. Topic: Clinical - Order For Equipment >> Nov 06, 2024  1:11 PM Nathanel DEL wrote: Reason for CRM: Pt saw Dr Theodoro on 10/17 and was to be set up w/ new local DME company. Pt has never heard anything from anyone.  Dr Theodoro doesn't mention which company in his notes. Pt would appreciate a c/b this afternoon concerning this.  Pt states it  is important too b/c the dr cannot see pt's info from his current cpap. Please advise.

## 2024-11-08 NOTE — Telephone Encounter (Signed)
 Pt calling w/ serial number of CPAP  Serial #  [id number]  Machine is a ResMed  Pt ask if you would please confirm via mychart this is all you need? Thank you!!

## 2024-11-14 ENCOUNTER — Telehealth: Payer: Self-pay | Admitting: *Deleted

## 2024-11-14 DIAGNOSIS — E291 Testicular hypofunction: Secondary | ICD-10-CM | POA: Diagnosis not present

## 2024-11-14 DIAGNOSIS — E785 Hyperlipidemia, unspecified: Secondary | ICD-10-CM | POA: Diagnosis not present

## 2024-11-14 NOTE — Telephone Encounter (Signed)
 Serial number entered in Airview.  Download printed for Dr. Theodoro to review.  Nothing further needed.

## 2024-11-14 NOTE — Telephone Encounter (Signed)
 Pt has been enrolled in Airview, [facility].

## 2024-11-23 DIAGNOSIS — I10 Essential (primary) hypertension: Secondary | ICD-10-CM | POA: Diagnosis not present

## 2024-11-23 DIAGNOSIS — R82998 Other abnormal findings in urine: Secondary | ICD-10-CM | POA: Diagnosis not present

## 2025-01-05 ENCOUNTER — Other Ambulatory Visit: Payer: Self-pay | Admitting: Neurology

## 2025-01-05 DIAGNOSIS — G20A1 Parkinson's disease without dyskinesia, without mention of fluctuations: Secondary | ICD-10-CM

## 2025-02-13 ENCOUNTER — Telehealth: Admitting: Neurology

## 2025-03-28 ENCOUNTER — Ambulatory Visit: Admitting: Neurology

## 2025-05-30 ENCOUNTER — Ambulatory Visit

## 2025-05-30 ENCOUNTER — Ambulatory Visit: Admitting: Occupational Therapy
